# Patient Record
Sex: Female | Born: 1955
Health system: Southern US, Community
[De-identification: ages and names within clinical notes are randomized; demographics above are authoritative.]

## PROBLEM LIST (undated history)

## (undated) DIAGNOSIS — J449 Chronic obstructive pulmonary disease, unspecified: Secondary | ICD-10-CM

## (undated) HISTORY — PX: APPENDECTOMY: SHX54

---

## 2009-01-26 ENCOUNTER — Ambulatory Visit: Payer: Self-pay | Admitting: Family Medicine

## 2009-12-16 ENCOUNTER — Ambulatory Visit: Payer: Self-pay | Admitting: Family Medicine

## 2010-04-05 ENCOUNTER — Ambulatory Visit: Payer: Self-pay | Admitting: Family Medicine

## 2010-07-20 ENCOUNTER — Ambulatory Visit: Payer: Self-pay | Admitting: Family Medicine

## 2011-05-23 ENCOUNTER — Ambulatory Visit: Payer: Self-pay

## 2014-05-19 ENCOUNTER — Ambulatory Visit
Admit: 2014-05-19 | Disposition: A | Discharge status: Home / Self Care | Payer: Self-pay | Attending: Family Medicine | Admitting: Family Medicine

## 2014-06-05 ENCOUNTER — Ambulatory Visit
Admit: 2014-06-05 | Disposition: A | Discharge status: Home / Self Care | Payer: Self-pay | Attending: Family Medicine | Admitting: Family Medicine

## 2014-06-17 ENCOUNTER — Other Ambulatory Visit: Payer: Self-pay | Admitting: Family Medicine

## 2014-06-17 DIAGNOSIS — R928 Other abnormal and inconclusive findings on diagnostic imaging of breast: Secondary | ICD-10-CM

## 2014-07-01 ENCOUNTER — Ambulatory Visit
Admission: RE | Admit: 2014-07-01 | Discharge: 2014-07-01 | Disposition: A | Discharge status: Home / Self Care | Payer: No Typology Code available for payment source | Source: Ambulatory Visit | Attending: Family Medicine | Admitting: Family Medicine

## 2014-07-01 DIAGNOSIS — R928 Other abnormal and inconclusive findings on diagnostic imaging of breast: Secondary | ICD-10-CM

## 2014-07-01 MED ORDER — GADOBENATE DIMEGLUMINE 529 MG/ML IV SOLN
12.0000 mL | Freq: Once | INTRAVENOUS | Status: AC | PRN
  Administered 2014-07-01: 12 mL via INTRAVENOUS

## 2014-07-03 ENCOUNTER — Other Ambulatory Visit: Payer: Self-pay | Admitting: Family Medicine

## 2014-07-03 DIAGNOSIS — R928 Other abnormal and inconclusive findings on diagnostic imaging of breast: Secondary | ICD-10-CM

## 2014-07-04 ENCOUNTER — Other Ambulatory Visit: Payer: Self-pay

## 2014-07-15 ENCOUNTER — Ambulatory Visit
Admission: RE | Admit: 2014-07-15 | Discharge: 2014-07-15 | Disposition: A | Discharge status: Home / Self Care | Payer: No Typology Code available for payment source | Source: Ambulatory Visit | Attending: Family Medicine | Admitting: Family Medicine

## 2014-07-15 DIAGNOSIS — R928 Other abnormal and inconclusive findings on diagnostic imaging of breast: Secondary | ICD-10-CM

## 2014-07-15 HISTORY — PX: BREAST BIOPSY: SHX20

## 2014-07-15 MED ORDER — GADOBENATE DIMEGLUMINE 529 MG/ML IV SOLN: 12.0000 mL | Freq: Once | INTRAVENOUS | Status: AC | PRN

## 2015-01-09 ENCOUNTER — Emergency Department: Payer: No Typology Code available for payment source

## 2015-01-09 ENCOUNTER — Encounter: Payer: Self-pay | Admitting: Emergency Medicine

## 2015-01-09 ENCOUNTER — Emergency Department
Admission: EM | Admit: 2015-01-09 | Discharge: 2015-01-09 | Disposition: A | Discharge status: Home / Self Care | Payer: No Typology Code available for payment source | Attending: Emergency Medicine | Admitting: Emergency Medicine

## 2015-01-09 DIAGNOSIS — Y9289 Other specified places as the place of occurrence of the external cause: Secondary | ICD-10-CM | POA: Insufficient documentation

## 2015-01-09 DIAGNOSIS — S8002XA Contusion of left knee, initial encounter: Secondary | ICD-10-CM | POA: Diagnosis not present

## 2015-01-09 DIAGNOSIS — S0990XA Unspecified injury of head, initial encounter: Secondary | ICD-10-CM | POA: Diagnosis present

## 2015-01-09 DIAGNOSIS — S80212A Abrasion, left knee, initial encounter: Secondary | ICD-10-CM | POA: Diagnosis not present

## 2015-01-09 DIAGNOSIS — Y9389 Activity, other specified: Secondary | ICD-10-CM | POA: Insufficient documentation

## 2015-01-09 DIAGNOSIS — S0083XA Contusion of other part of head, initial encounter: Secondary | ICD-10-CM | POA: Diagnosis not present

## 2015-01-09 DIAGNOSIS — W1789XA Other fall from one level to another, initial encounter: Secondary | ICD-10-CM | POA: Insufficient documentation

## 2015-01-09 DIAGNOSIS — S93402A Sprain of unspecified ligament of left ankle, initial encounter: Secondary | ICD-10-CM

## 2015-01-09 DIAGNOSIS — Y998 Other external cause status: Secondary | ICD-10-CM | POA: Diagnosis not present

## 2015-01-09 MED ORDER — MELOXICAM 15 MG PO TABS: 15.0000 mg | ORAL_TABLET | Freq: Every day | ORAL | Status: AC

## 2015-01-09 MED ORDER — BACITRACIN ZINC 500 UNIT/GM EX OINT
TOPICAL_OINTMENT | Freq: Once | CUTANEOUS | Status: AC
  Administered 2015-01-09: 1 via TOPICAL
  Filled 2015-01-09: qty 0.9

## 2015-01-09 MED ORDER — MELOXICAM 7.5 MG PO TABS
15.0000 mg | ORAL_TABLET | Freq: Once | ORAL | Status: AC
  Administered 2015-01-09: 15 mg via ORAL
  Filled 2015-01-09: qty 2

## 2015-01-09 NOTE — ED Notes (Signed)
Patient with no complaints at this time. Respirations even and unlabored. Skin warm/dry. Discharge instructions reviewed with patient at this time. Patient given opportunity to voice concerns/ask questions. Patient discharged at this time and left Emergency Department, via wheelchair.   

## 2015-01-09 NOTE — ED Provider Notes (Signed)
[facility] Emergency Department Provider Note  ____________________________________________  Time seen: Approximately 8:52 PM  I have reviewed the triage vital signs and the nursing notes.   HISTORY  Chief Complaint Fall    HPI Betty Ramirez is a 59 y.o. female resents emergency department status post fall. Per the patient she was standing on a couch taking a picture of a Christmas tree when she lost her balance falling and landing on her left ankle knee and eventually striking her head on the coffee table. She denies being dizzy prior to event. She denies loss of consciousness or striking her head. Patient states she does not take any blood thinners. She hasn't forced some mild nauseousness since even but denies any vomiting. She denies any visual acuity changes, neck pain, chest pain, shortness of breath. Patient endorses an abrasion to the left knee as well. She states her left knee "aches," her left ankle is a sharp pain that is constant worse with weightbearing. She reports her headache is mild.   History reviewed. No pertinent past medical history.  There are no active problems to display for this patient.   History reviewed. No pertinent past surgical history.  Current Outpatient Rx  Name  Route  Sig  Dispense  Refill  . meloxicam (MOBIC) 15 MG tablet   Oral   Take 1 tablet (15 mg total) by mouth daily.   30 tablet   0     Allergies Biaxin and Sulfa antibiotics  No family history on file.  Social History Social History  Substance Use Topics  . Smoking status: Never Smoker   . Smokeless tobacco: None  . Alcohol Use: No    Review of Systems Constitutional: No fever/chills Eyes: No visual changes. ENT: No sore throat. Cardiovascular: Denies chest pain. Respiratory: Denies shortness of breath. Gastrointestinal: No abdominal pain.  No nausea, no vomiting.  No diarrhea.  No constipation. Genitourinary: Negative for  dysuria. Musculoskeletal: Negative for back pain. Her cyst left knee pain. Endorses left ankle pain  Skin: Negative for rash. Neurological: Endorses a headache but denies focal weakness or numbness.  10-point ROS otherwise negative.  ____________________________________________   PHYSICAL EXAM:  VITAL SIGNS: ED Triage Vitals  Enc Vitals Group     BP 01/09/15 1933 182/81 mmHg     Pulse Rate 01/09/15 1933 74     Resp 01/09/15 1933 20     Temp 01/09/15 1933 98.2 F (36.8 C)     Temp Source 01/09/15 1933 Oral     SpO2 01/09/15 1933 98 %     Weight 01/09/15 1933 138 lb (62.596 kg)     Height 01/09/15 1933 5\' 2"  (1.575 m)     Head Cir --      Peak Flow --      Pain Score 01/09/15 1930 8     Pain Loc --      Pain Edu? --      Excl. in GC? --     Constitutional: Alert and oriented. Well appearing and in no acute distress. Eyes: Conjunctivae are normal. PERRL. EOMI. Head: Hematoma noted to left forehead. No crepitus. Tenderness to palpation over hematoma but no tenderness elsewhere. External auditory canals are within normal limits with no serosanguineous discharge. Nose: No congestion/rhinnorhea. No serosanguineous discharge from nares. Mouth/Throat: Mucous membranes are moist.  Oropharynx non-erythematous. Neck: No stridor.  No cervical spine tenderness to palpation. Cardiovascular: Normal rate, regular rhythm. Grossly normal heart sounds.  Good peripheral circulation. Respiratory: Normal  respiratory effort.  No retractions. Lungs CTAB. Gastrointestinal: Soft and nontender. No distention. No abdominal bruits. No CVA tenderness. Musculoskeletal: Mild edema noted to the anterior aspect of left knee compared with right. Diffuse tenderness to palpation over the surface level over abrasion. No palpable deformity. Full range of motion to knee. Varus, valgus, Lachman's negative. No visible deformity or edema noted to the left foot and ankle when compared with right. Patient is diffusely  tender to palpation over the lateral aspect of foot. No tenderness to palpation over the malleolus. Full range of motion to ankle and foot. Sensation and pulses intact distally. Neurologic:  Normal speech and language. No gross focal neurologic deficits are appreciated. No gait instability. Radial nerves II through XII grossly intact. Skin:  Skin is warm, dry and intact. No rash noted. Superficial abrasion noted to left knee. No visible foreign body. No bleeding. Psychiatric: Mood and affect are normal. Speech and behavior are normal.  ____________________________________________   LABS (all labs ordered are listed, but only abnormal results are displayed)  Labs Reviewed - No data to display ____________________________________________  EKG   ____________________________________________  RADIOLOGY  Left foot x-ray Impression: No acute bony abnormality. ____________________________________________   PROCEDURES  Procedure(s) performed: None  Critical Care performed: No  ____________________________________________   INITIAL IMPRESSION / ASSESSMENT AND PLAN / ED COURSE  Pertinent labs & imaging results that were available during my care of the patient were reviewed by me and considered in my medical decision making (see chart for details).  The patient's history, symptoms, physical exam are taken and consideration for diagnosis. Advised patient of findings and diagnosis she verbalizes understanding of same. Patient was neurologically intact. Discussed pros and cons of head CT and patient and her husband decided to not proceed with head CT at this time. I concur with their assessment. Patient will not receive a head CT. Patient is given strict ED precautions to return should symptoms worsen to include loss of consciousness, increased nausea or onset of vomiting, she'll acuity changes, neck pain, altered mental status. Patient has a diagnosis of contusion to left knee with abrasion  and left ankle sprain. I'll place the patient on meloxicam for symptomatic control. The patient is to keep the left knee wrapped with Ace bandage which is provided here in the emergency department. Patient will follow-up with primary care or orthopedics should symptoms persist past treatment course. Patient verbalizes understanding and agreement with treatment plan. ____________________________________________   FINAL CLINICAL IMPRESSION(S) / ED DIAGNOSES  Final diagnoses:  Traumatic hematoma of forehead, initial encounter  Abrasion, knee, left, initial encounter  Knee contusion, left, initial encounter  Ankle sprain, left, initial encounter      Racheal Patches, PA-C 01/09/15 2118  Loleta Rose, MD 01/09/15 2322

## 2015-01-09 NOTE — Discharge Instructions (Signed)
Abrasion An abrasion is a cut or scrape on the outer surface of your skin. An abrasion does not extend through all of the layers of your skin. It is important to care for your abrasion properly to prevent infection. CAUSES Most abrasions are caused by falling on or gliding across the ground or another surface. When your skin rubs on something, the outer and inner layer of skin rubs off.  SYMPTOMS A cut or scrape is the main symptom of this condition. The scrape may be bleeding, or it may appear red or pink. If there was an associated fall, there may be an underlying bruise. DIAGNOSIS An abrasion is diagnosed with a physical exam. TREATMENT Treatment for this condition depends on how large and deep the abrasion is. Usually, your abrasion will be cleaned with water and mild soap. This removes any dirt or debris that may be stuck. An antibiotic ointment may be applied to the abrasion to help prevent infection. A bandage (dressing) may be placed on the abrasion to keep it clean. You may also need a tetanus shot. HOME CARE INSTRUCTIONS Medicines  Take or apply medicines only as directed by your health care provider.  If you were prescribed an antibiotic ointment, finish all of it even if you start to feel better. Wound Care  Clean the wound with mild soap and water 2-3 times per day or as directed by your health care provider. Pat your wound dry with a clean towel. Do not rub it.  There are many different ways to close and cover a wound. Follow instructions from your health care provider about:  Wound care.  Dressing changes and removal.  Check your wound every day for signs of infection. Watch for:  Redness, swelling, or pain.  Fluid, blood, or pus. General Instructions  Keep the dressing dry as directed by your health care provider. Do not take baths, swim, use a hot tub, or do anything that would put your wound underwater until your health care provider approves.  If there is  swelling, raise (elevate) the injured area above the level of your heart while you are sitting or lying down.  Keep all follow-up visits as directed by your health care provider. This is important. SEEK MEDICAL CARE IF:  You received a tetanus shot and you have swelling, severe pain, redness, or bleeding at the injection site.  Your pain is not controlled with medicine.  You have increased redness, swelling, or pain at the site of your wound. SEEK IMMEDIATE MEDICAL CARE IF:  You have a red streak going away from your wound.  You have a fever.  You have fluid, blood, or pus coming from your wound.  You notice a bad smell coming from your wound or your dressing.   This information is not intended to replace advice given to you by your health care provider. Make sure you discuss any questions you have with your health care provider.   Document Released: 11/09/2004 Document Revised: 10/21/2014 Document Reviewed: 01/28/2014 Elsevier Interactive Patient Education 2016 [employer]  Ankle Sprain An ankle sprain is an injury to the strong, fibrous tissues (ligaments) that hold the bones of your ankle joint together.  CAUSES An ankle sprain is usually caused by a fall or by twisting your ankle. Ankle sprains most commonly occur when you step on the outer edge of your foot, and your ankle turns inward. People who participate in sports are more prone to these types of injuries.  SYMPTOMS   Pain  in your ankle. The pain may be present at rest or only when you are trying to stand or walk.  Swelling.  Bruising. Bruising may develop immediately or within 1 to 2 days after your injury.  Difficulty standing or walking, particularly when turning corners or changing directions. DIAGNOSIS  Your caregiver will ask you details about your injury and perform a physical exam of your ankle to determine if you have an ankle sprain. During the physical exam, your caregiver will press on and apply  pressure to specific areas of your foot and ankle. Your caregiver will try to move your ankle in certain ways. An X-ray exam may be done to be sure a bone was not broken or a ligament did not separate from one of the bones in your ankle (avulsion fracture).  TREATMENT  Certain types of braces can help stabilize your ankle. Your caregiver can make a recommendation for this. Your caregiver may recommend the use of medicine for pain. If your sprain is severe, your caregiver may refer you to a surgeon who helps to restore function to parts of your skeletal system (orthopedist) or a physical therapist. HOME CARE INSTRUCTIONS   Apply ice to your injury for 1-2 days or as directed by your caregiver. Applying ice helps to reduce inflammation and pain.  Put ice in a plastic bag.  Place a towel between your skin and the bag.  Leave the ice on for 15-20 minutes at a time, every 2 hours while you are awake.  Only take over-the-counter or prescription medicines for pain, discomfort, or fever as directed by your caregiver.  Elevate your injured ankle above the level of your heart as much as possible for 2-3 days.  If your caregiver recommends crutches, use them as instructed. Gradually put weight on the affected ankle. Continue to use crutches or a cane until you can walk without feeling pain in your ankle.  If you have a plaster splint, wear the splint as directed by your caregiver. Do not rest it on anything harder than a pillow for the first 24 hours. Do not put weight on it. Do not get it wet. You may take it off to take a shower or bath.  You may have been given an elastic bandage to wear around your ankle to provide support. If the elastic bandage is too tight (you have numbness or tingling in your foot or your foot becomes cold and blue), adjust the bandage to make it comfortable.  If you have an air splint, you may blow more air into it or let air out to make it more comfortable. You may take your  splint off at night and before taking a shower or bath. Wiggle your toes in the splint several times per day to decrease swelling. SEEK MEDICAL CARE IF:   You have rapidly increasing bruising or swelling.  Your toes feel extremely cold or you lose feeling in your foot.  Your pain is not relieved with medicine. SEEK IMMEDIATE MEDICAL CARE IF:  Your toes are numb or blue.  You have severe pain that is increasing. MAKE SURE YOU:   Understand these instructions.  Will watch your condition.  Will get help right away if you are not doing well or get worse.   This information is not intended to replace advice given to you by your health care provider. Make sure you discuss any questions you have with your health care provider.   Document Released: 01/30/2005 Document Revised: 02/20/2014 Document  Reviewed: 02/11/2011 Elsevier Interactive Patient Education 2016 [employer] Inc.  Contusion A contusion is a deep bruise. Contusions are the result of a blunt injury to tissues and muscle fibers under the skin. The injury causes bleeding under the skin. The skin overlying the contusion may turn blue, purple, or yellow. Minor injuries will give you a painless contusion, but more severe contusions may stay painful and swollen for a few weeks.  CAUSES  This condition is usually caused by a blow, trauma, or direct force to an area of the body. SYMPTOMS  Symptoms of this condition include:  Swelling of the injured area.  Pain and tenderness in the injured area.  Discoloration. The area may have redness and then turn blue, purple, or yellow. DIAGNOSIS  This condition is diagnosed based on a physical exam and medical history. An X-ray, CT scan, or MRI may be needed to determine if there are any associated injuries, such as broken bones (fractures). TREATMENT  Specific treatment for this condition depends on what area of the body was injured. In general, the best treatment for a contusion is resting,  icing, applying pressure to (compression), and elevating the injured area. This is often called the RICE strategy. Over-the-counter anti-inflammatory medicines may also be recommended for pain control.  HOME CARE INSTRUCTIONS   Rest the injured area.  If directed, apply ice to the injured area:  Put ice in a plastic bag.  Place a towel between your skin and the bag.  Leave the ice on for 20 minutes, 2-3 times per day.  If directed, apply light compression to the injured area using an elastic bandage. Make sure the bandage is not wrapped too tightly. Remove and reapply the bandage as directed by your health care provider.  If possible, raise (elevate) the injured area above the level of your heart while you are sitting or lying down.  Take over-the-counter and prescription medicines only as told by your health care provider. SEEK MEDICAL CARE IF:  Your symptoms do not improve after several days of treatment.  Your symptoms get worse.  You have difficulty moving the injured area. SEEK IMMEDIATE MEDICAL CARE IF:   You have severe pain.  You have numbness in a hand or foot.  Your hand or foot turns pale or cold.   This information is not intended to replace advice given to you by your health care provider. Make sure you discuss any questions you have with your health care provider.   Document Released: 11/09/2004 Document Revised: 10/21/2014 Document Reviewed: 06/17/2014 Elsevier Interactive Patient Education 2016 Else[employer] Inc.  Adult nurselastic Bandage and RICE WHAT DOES AN ELASTIC BANDAGE DO? Elastic bandages come in different shapes and sizes. They generally provide support to your injury and reduce swelling while you are healing, but they can perform different functions. Your health care provider will help you to decide what is best for your protection, recovery, or rehabilitation following an injury. WHAT ARE SOME GENERAL TIPS FOR USING AN ELASTIC BANDAGE?  Use the bandage as  directed by the maker of the bandage that you are using.  Do not wrap the bandage too tightly. This may cut off the circulation in the arm or leg in the area below the bandage.  If part of your body beyond the bandage becomes blue, numb, cold, swollen, or is more painful, your bandage is most likely too tight. If this occurs, remove your bandage and reapply it more loosely.  See your health care provider if the bandage seems  to be making your problems worse rather than better.  An elastic bandage should be removed and reapplied every 3-4 hours or as directed by your health care provider. WHAT IS RICE? The routine care of many injuries includes rest, ice, compression, and elevation (RICE therapy).  Rest Rest is required to allow your body to heal. Generally, you can resume your routine activities when you are comfortable and have been given permission by your health care provider. Ice Icing your injury helps to keep the swelling down and it reduces pain. Do not apply ice directly to your skin.  Put ice in a plastic bag.  Place a towel between your skin and the bag.  Leave the ice on for 20 minutes, 2-3 times per day. Do this for as long as you are directed by your health care provider. Compression Compression helps to keep swelling down, gives support, and helps with discomfort. Compression may be done with an elastic bandage. Elevation Elevation helps to reduce swelling and it decreases pain. If possible, your injured area should be placed at or above the level of your heart or the center of your chest. WHEN SHOULD I SEEK MEDICAL CARE? You should seek medical care if:  You have persistent pain and swelling.  Your symptoms are getting worse rather than improving. These symptoms may indicate that further evaluation or further X-rays are needed. Sometimes, X-rays may not show a small broken bone (fracture) until a number of days later. Make a follow-up appointment with your health care  provider. Ask when your X-ray results will be ready. Make sure that you get your X-ray results. WHEN SHOULD I SEEK IMMEDIATE MEDICAL CARE? You should seek immediate medical care if:  You have a sudden onset of severe pain at or below the area of your injury.  You develop redness or increased swelling around your injury.  You have tingling or numbness at or below the area of your injury that does not improve after you remove the elastic bandage.   This information is not intended to replace advice given to you by your health care provider. Make sure you discuss any questions you have with your health care provider.   Document Released: 07/22/2001 Document Revised: 10/21/2014 Document Reviewed: 09/15/2013 Elsevier Interactive Patient Education [employer]

## 2015-01-09 NOTE — ED Notes (Signed)
Pt fell about 45 minutes ago.  Pt was taking a picture and fell and hit her head and twisted her left ankle.  Pt denies losing consciousness or having any dizziness pre or post fall.  Pt is AOx4.  Pt denies on being on any blood thinner.  She did have some N/V post fall.

## 2016-06-20 ENCOUNTER — Telehealth: Payer: Self-pay | Admitting: *Deleted

## 2016-06-20 NOTE — Telephone Encounter (Signed)
Received referral for initial lung cancer screening scan. Contacted patient and obtained smoking history,(*) as well as answering questions related to screening process. Patient denies signs of lung cancer such as weight loss or hemoptysis. Patient denies comorbidity that would prevent curative treatment if lung cancer were found. Patient must have appt on a Wednesday or later in the day. Will contact patient in June as the requested appt is currently not available.

## 2016-07-31 ENCOUNTER — Ambulatory Visit: Payer: Self-pay | Attending: Oncology

## 2016-07-31 ENCOUNTER — Ambulatory Visit
Admission: RE | Admit: 2016-07-31 | Discharge: 2016-07-31 | Disposition: A | Discharge status: Home / Self Care | Payer: Self-pay | Source: Ambulatory Visit | Attending: Oncology | Admitting: Oncology

## 2016-07-31 VITALS — BP 131/83 | HR 60 | Temp 98.3°F | Ht 63.0 in | Wt 138.0 lb

## 2016-07-31 DIAGNOSIS — N63 Unspecified lump in unspecified breast: Secondary | ICD-10-CM

## 2016-07-31 NOTE — Progress Notes (Signed)
Subjective:     Patient ID: Betty Ramirez, female   DOB: Sep 28, 1955, 61 y.o.   MRN: [id number]  HPI   Review of Systems     Objective:   Physical Exam  Pulmonary/Chest: Right breast exhibits no inverted nipple, no mass, no nipple discharge, no skin change and no tenderness. Left breast exhibits no inverted nipple, no mass, no nipple discharge, no skin change and no tenderness. Breasts are symmetrical.    Biopsy scar left breast       Assessment:     61 year old patient presents for [facility] clinic visit.  Patient had benign left breast biopsy at [facility] in June 2016, but did not return for recommended 6 month follow-up mammogram, and ultrasound.  Patient screened, and meets [facility] eligibility.  Patient does not have insurance, Medicare or Medicaid.  Handout given on Affordable Care Act.Instructed patient on breast self-exam using teach back method  CBE unremarkable. No mass or lump palpated.     Plan:     Sent for bilateral diagnostic mammogram and ultrasound

## 2016-07-31 NOTE — Progress Notes (Signed)
Letter mailed from [facility] to notify of normal mammogram results.  Patient to return in one year for annual screening.  Copy to [facility]. 

## 2016-08-08 ENCOUNTER — Telehealth: Payer: Self-pay | Admitting: *Deleted

## 2016-08-08 NOTE — Telephone Encounter (Signed)
Received referral for low dose lung cancer screening CT scan. Message left at phone number listed in EMR for patient to call me back to facilitate scheduling scan.  

## 2016-08-09 ENCOUNTER — Telehealth: Payer: Self-pay | Admitting: *Deleted

## 2016-08-09 NOTE — Telephone Encounter (Signed)
Received referral for low dose lung cancer screening CT scan. Message left at phone number listed in EMR for patient to call me back to facilitate scheduling scan.  

## 2016-08-31 ENCOUNTER — Encounter: Payer: Self-pay | Admitting: *Deleted

## 2017-03-04 ENCOUNTER — Emergency Department: Payer: Self-pay

## 2017-03-04 ENCOUNTER — Other Ambulatory Visit: Payer: Self-pay

## 2017-03-04 ENCOUNTER — Emergency Department
Admission: EM | Admit: 2017-03-04 | Discharge: 2017-03-04 | Disposition: A | Discharge status: Home / Self Care | Payer: Self-pay | Attending: Emergency Medicine | Admitting: Emergency Medicine

## 2017-03-04 ENCOUNTER — Encounter: Payer: Self-pay | Admitting: *Deleted

## 2017-03-04 DIAGNOSIS — J101 Influenza due to other identified influenza virus with other respiratory manifestations: Secondary | ICD-10-CM

## 2017-03-04 DIAGNOSIS — Z87891 Personal history of nicotine dependence: Secondary | ICD-10-CM | POA: Insufficient documentation

## 2017-03-04 DIAGNOSIS — J102 Influenza due to other identified influenza virus with gastrointestinal manifestations: Secondary | ICD-10-CM | POA: Insufficient documentation

## 2017-03-04 DIAGNOSIS — Z79899 Other long term (current) drug therapy: Secondary | ICD-10-CM | POA: Insufficient documentation

## 2017-03-04 DIAGNOSIS — J441 Chronic obstructive pulmonary disease with (acute) exacerbation: Secondary | ICD-10-CM | POA: Insufficient documentation

## 2017-03-04 DIAGNOSIS — R112 Nausea with vomiting, unspecified: Secondary | ICD-10-CM

## 2017-03-04 DIAGNOSIS — R197 Diarrhea, unspecified: Secondary | ICD-10-CM

## 2017-03-04 HISTORY — DX: Chronic obstructive pulmonary disease, unspecified: J44.9

## 2017-03-04 LAB — COMPREHENSIVE METABOLIC PANEL
ALT: 24 U/L (ref 14–54)
AST: 32 U/L (ref 15–41)
Albumin: 4 g/dL (ref 3.5–5.0)
Alkaline Phosphatase: 68 U/L (ref 38–126)
Anion gap: 9 (ref 5–15)
BILIRUBIN TOTAL: 0.7 mg/dL (ref 0.3–1.2)
BUN: 11 mg/dL (ref 6–20)
CHLORIDE: 105 mmol/L (ref 101–111)
CO2: 26 mmol/L (ref 22–32)
CREATININE: 0.73 mg/dL (ref 0.44–1.00)
Calcium: 9 mg/dL (ref 8.9–10.3)
GFR calc Af Amer: >60 mL/min (ref >60)
GLUCOSE: 106 mg/dL — AB (ref 65–99)
Potassium: 3.4 mmol/L — ABNORMAL LOW (ref 3.5–5.1)
Sodium: 140 mmol/L (ref 135–145)
Total Protein: 7.6 g/dL (ref 6.5–8.1)

## 2017-03-04 LAB — CBC
HCT: 45.3 % (ref 35.0–47.0)
Hemoglobin: 15.1 g/dL (ref 12.0–16.0)
MCH: 29.2 pg (ref 26.0–34.0)
MCHC: 33.4 g/dL (ref 32.0–36.0)
MCV: 87.5 fL (ref 80.0–100.0)
PLATELETS: 222 10*3/uL (ref 150–440)
RBC: 5.18 MIL/uL (ref 3.80–5.20)
RDW: 13.7 % (ref 11.5–14.5)
WBC: 5.8 10*3/uL (ref 3.6–11.0)

## 2017-03-04 LAB — URINALYSIS, COMPLETE (UACMP) WITH MICROSCOPIC
BILIRUBIN URINE: NEGATIVE
Bacteria, UA: NONE SEEN
Glucose, UA: NEGATIVE mg/dL
Ketones, ur: 20 mg/dL — AB
LEUKOCYTES UA: NEGATIVE
Nitrite: NEGATIVE
PH: 6 (ref 5.0–8.0)
Protein, ur: NEGATIVE mg/dL
SPECIFIC GRAVITY, URINE: 1.005 (ref 1.005–1.030)

## 2017-03-04 LAB — INFLUENZA PANEL BY PCR (TYPE A & B)
INFLAPCR: POSITIVE — AB
INFLBPCR: NEGATIVE

## 2017-03-04 LAB — TROPONIN I

## 2017-03-04 LAB — LIPASE, BLOOD: LIPASE: 34 U/L (ref 11–51)

## 2017-03-04 MED ORDER — METHYLPREDNISOLONE SODIUM SUCC 125 MG IJ SOLR
125.0000 mg | Freq: Once | INTRAMUSCULAR | Status: AC
  Administered 2017-03-04: 125 mg via INTRAVENOUS
  Filled 2017-03-04: qty 2

## 2017-03-04 MED ORDER — ONDANSETRON HCL 4 MG PO TABS: 4.0000 mg | ORAL_TABLET | Freq: Three times a day (TID) | ORAL | 0 refills | Status: DC | PRN

## 2017-03-04 MED ORDER — OSELTAMIVIR PHOSPHATE 75 MG PO CAPS: 75.0000 mg | ORAL_CAPSULE | Freq: Two times a day (BID) | ORAL | 0 refills | Status: AC

## 2017-03-04 MED ORDER — OSELTAMIVIR PHOSPHATE 75 MG PO CAPS
75.0000 mg | ORAL_CAPSULE | Freq: Once | ORAL | Status: AC
  Administered 2017-03-04: 75 mg via ORAL
  Filled 2017-03-04: qty 1

## 2017-03-04 MED ORDER — ONDANSETRON 4 MG PO TBDP: 4.0000 mg | ORAL_TABLET | Freq: Three times a day (TID) | ORAL | 0 refills | Status: AC | PRN

## 2017-03-04 MED ORDER — IPRATROPIUM-ALBUTEROL 0.5-2.5 (3) MG/3ML IN SOLN
3.0000 mL | Freq: Once | RESPIRATORY_TRACT | Status: AC
  Administered 2017-03-04: 3 mL via RESPIRATORY_TRACT
  Filled 2017-03-04: qty 3

## 2017-03-04 MED ORDER — PREDNISONE 10 MG PO TABS: ORAL_TABLET | ORAL | 0 refills | Status: DC

## 2017-03-04 MED ORDER — SODIUM CHLORIDE 0.9 % IV BOLUS (SEPSIS)
1000.0000 mL | Freq: Once | INTRAVENOUS | Status: AC
  Administered 2017-03-04: 1000 mL via INTRAVENOUS

## 2017-03-04 NOTE — ED Notes (Signed)

## 2017-03-04 NOTE — ED Provider Notes (Signed)
[facility] Emergency Department Provider Note ____________________________________________   I have reviewed the triage vital signs and the triage nursing note.  HISTORY  Chief Complaint Emesis and Diarrhea   Historian Patient  HPI Betty Ramirez is a 62 y.o. female with a history of COPD, states that she went to her primary care doctor's office on Tuesday and was placed on doxycycline and a cough syrup for upper respiratory infection, and then on Thursday she started to have nausea and by Friday and Saturday she has had numerous episodes of emesis as well as dry heaving which is continued.  Nonbloody.  She is also had loose stools, several times.  No black or bloody stools.  Some chills without documented fevers.  She continues to wheeze and have trouble breathing.  Mild soreness of the abdomen from heaving.  No focal abdominal pain.     Past Medical History:  Diagnosis Date  . COPD (chronic obstructive pulmonary disease) (HCC)     There are no active problems to display for this patient.   Past Surgical History:  Procedure Laterality Date  . BREAST BIOPSY Left 07/15/2014   Neg    Prior to Admission medications   Medication Sig Start Date End Date Taking? Authorizing Provider  meloxicam (MOBIC) 15 MG tablet Take 1 tablet (15 mg total) by mouth daily. 01/09/15   Cuthriell, Delorise Royals, PA-C  ondansetron (ZOFRAN) 4 MG tablet Take 1 tablet (4 mg total) by mouth every 8 (eight) hours as needed for nausea or vomiting. 03/04/17   Governor Rooks, MD  predniSONE (DELTASONE) 10 MG tablet 40 mg daily for 4 more days 03/04/17   Governor Rooks, MD    Allergies  Allergen Reactions  . Biaxin [Clarithromycin]   . Sulfa Antibiotics     Family History  Problem Relation Age of Onset  . Breast cancer Neg Hx     Social History Social History   Tobacco Use  . Smoking status: Former Games developer  . Smokeless tobacco: Never Used  Substance Use Topics  .  Alcohol use: No  . Drug use: Not on file    Review of Systems  Constitutional: Negative for fever, for some chills. Eyes: Negative for visual changes. ENT: Negative for sore throat. Cardiovascular: Negative for chest pain. Respiratory: Positive for wheezing coughing and for shortness of breath.  No productive sputum. Gastrointestinal: Abdominal soreness.  Vomiting and diarrhea as per HPI. Genitourinary: Negative for dysuria. Musculoskeletal: Negative for back pain. Skin: Negative for rash. Neurological: Negative for headache.  ____________________________________________   PHYSICAL EXAM:  VITAL SIGNS: ED Triage Vitals  Enc Vitals Group     BP 03/04/17 1403 134/89     Pulse Rate 03/04/17 1403 86     Resp 03/04/17 1403 16     Temp 03/04/17 1403 98.2 F (36.8 C)     Temp Source 03/04/17 1403 Oral     SpO2 03/04/17 1403 94 %     Weight 03/04/17 1403 138 lb (62.6 kg)     Height 03/04/17 1422 5\' 3"  (1.6 m)     Head Circumference --      Peak Flow --      Pain Score 03/04/17 1403 7     Pain Loc --      Pain Edu? --      Excl. in GC? --      Constitutional: Alert and oriented. Well appearing and in no distress. HEENT   Head: Normocephalic and atraumatic.  Eyes: Conjunctivae are normal. Pupils equal and round.       Ears:         Nose: No congestion/rhinnorhea.   Mouth/Throat: Mucous membranes are moderately dry.   Neck: No stridor. Cardiovascular/Chest: Normal rate, regular rhythm.  No murmurs, rubs, or gallops. Respiratory: Normal respiratory effort without tachypnea nor retractions. Moderate wheezing in all fields. Gastrointestinal: Soft. No distention, no guarding, no rebound. Nontender.    Genitourinary/rectal:Deferred Musculoskeletal: Nontender with normal range of motion in all extremities. No joint effusions.  No lower extremity tenderness.  No edema. Neurologic:  Normal speech and language. No gross or focal neurologic deficits are  appreciated. Skin:  Skin is warm, dry and intact. No rash noted. Psychiatric: Mood and affect are normal. Speech and behavior are normal. Patient exhibits appropriate insight and judgment.   ____________________________________________  LABS (pertinent positives/negatives) I, Governor Rooksebecca Philbert Ocallaghan, MD the attending physician have reviewed the labs noted below.  Labs Reviewed  COMPREHENSIVE METABOLIC PANEL - Abnormal; Notable for the following components:      Result Value   Potassium 3.4 (*)    Glucose, Bld 106 (*)    All other components within normal limits  LIPASE, BLOOD  CBC  TROPONIN I  URINALYSIS, COMPLETE (UACMP) WITH MICROSCOPIC  INFLUENZA PANEL BY PCR (TYPE A & B)    ____________________________________________    EKG I, Governor Rooksebecca Caralyn Twining, MD, the attending physician have personally viewed and interpreted all ECGs.  86 bpm.  Normal sinus rhythm.  Nonspecific intraventricular conduction delay.  Nonspecific ST and T wave ____________________________________________  RADIOLOGY All Xrays were viewed by me.  Imaging interpreted by Radiologist, and I, Governor Rooksebecca Gwendola Hornaday, MD the attending physician have reviewed the radiologist interpretation noted below.  Dg abd with chest:  IMPRESSION: No active cardiopulmonary or abdominal process. Stable changes of probable chronic obstructive pulmonary disease. __________________________________________  PROCEDURES  Procedure(s) performed: None  Critical Care performed: None   ____________________________________________  ED COURSE / ASSESSMENT AND PLAN  Pertinent labs & imaging results that were available during my care of the patient were reviewed by me and considered in my medical decision making (see chart for details).   Patient looks very dehydrated clinically and is giving a history of vomiting and diarrhea for almost 4 days now and then upper respiratory bronchitis/COPD exacerbation for about a week.  She is not hypoxic and is  not having fever here and at no hypotension or tachycardia, however I am going to treat her for COPD exacerbation and add Solu-Medrol.  She looks dehydrated and is getting IV fluid bolus here.  I am treating her with Zofran for nausea.  She is having abdominal cramping so I am going to give her a small dose of morphine.  Overall her laboratory studies are reassuring with no significant electrolyte disturbance.  She does not have an elevated white blood cell count.  She does not have kidney failure.   Patient care to be transferred to Dr. Don PerkingVeronese at shift change.  Influenza, urine analysis, x-ray, and reevaluation of breathing and nausea and clinical symptoms of dehydration for disposition.  If patient reassuring, I have prepared discharge instructions.   DIFFERENTIAL DIAGNOSIS: Including but not limited to COPD exacerbation, bronchitis, influenza, viral gastroenteritis, sepsis, dehydration, electrolyte disturbance, anemia, pneumonia, etc.  CONSULTATIONS:  None   Patient / Family / Caregiver informed of clinical course, medical decision-making process, and agree with plan.   I discussed return precautions, follow-up instructions, and discharge instructions with patient and/or family.  Discharge Instructions :  You are evaluated for wheezing and are being treated for COPD exacerbation, and I am adding prednisone.  You were also evaluated for vomiting and diarrhea, and are being discharged with nausea medication.  You may try over-the-counter Imodium for diarrhea symptoms.  Please continue to make sure you are drinking in any fluids.  Your exam and evaluation are overall reassuring in the emergency department today.  Return to the emergency department immediately for any worsening condition for any fever, vomiting blood, black or bloody stools, worsening or uncontrolled abdominal pain, chest pain, dizziness or passing out, confusion or altered mental status, or any other symptoms concerning  to you.     ___________________________________________   FINAL CLINICAL IMPRESSION(S) / ED DIAGNOSES   Final diagnoses:  COPD with acute exacerbation (HCC)  Diarrhea, unspecified type  Nausea and vomiting, intractability of vomiting not specified, unspecified vomiting type      ___________________________________________        Note: This dictation was prepared with Dragon dictation. Any transcriptional errors that result from this process are unintentional    Governor Rooks, MD 03/04/17 1547

## 2017-03-04 NOTE — ED Triage Notes (Signed)
Pt to ED reporting NVD for the past two days. No fevers at home. PT reports she has had hot flashes and cold chills. 4 episodes of dry heaves reported to day and 2 episodes of diarrhea. Middle, generalized abd reported.

## 2017-03-04 NOTE — ED Provider Notes (Signed)
-----------------------------------------   5:18 PM on 03/04/2017 -----------------------------------------   Blood pressure 134/89, pulse 86, temperature 98.2 F (36.8 C), temperature source Oral, resp. rate 16, height 5\' 3"  (1.6 m), weight 56.2 kg (124 lb), SpO2 94 %.  Assuming care from Dr. Shaune PollackLord of Betty BottcherJanice L Ramirez is a 62 y.o. female with a chief complaint of Emesis and Diarrhea .    Please refer to H&P by previous MD for further details.  The current plan of care is to f/u Flu and reassess after duonebs. Flu positive. Will start patient on Tamiflu. The patient reports that she feels improved after DuoNeb and steroids. She is ready on doxycycline which was started by PCP. She is moving good air with normal vital signs. No episodes of vomiting or diarrhea in the emergency room looks well-hydrated after receiving a liter bolus. Labs are within normal limits. Will dc on continued doxy, start prednisone and Tamiflu. Given zofran as well. Discussed return precautions with patient and close f/u with PCP.        Don PerkingVeronese, [place], MD 03/04/17 [phone]

## 2017-03-04 NOTE — Discharge Instructions (Addendum)
You are evaluated for wheezing and are being treated for COPD exacerbation and Flu, and I am adding prednisone and tamiflu. You are also being given nausea medicine. Please continue to make sure you are drinking in any fluids.  Your exam and evaluation are overall reassuring in the emergency department today.  Return to the emergency department immediately for any worsening condition for any fever, vomiting blood, black or bloody stools, worsening or uncontrolled abdominal pain, chest pain, dizziness or passing out, confusion or altered mental status, or any other symptoms concerning to you.

## 2017-12-19 ENCOUNTER — Other Ambulatory Visit: Payer: Self-pay

## 2017-12-19 ENCOUNTER — Ambulatory Visit
Admission: RE | Admit: 2017-12-19 | Discharge: 2017-12-19 | Disposition: A | Discharge status: Home / Self Care | Payer: Self-pay | Source: Ambulatory Visit | Attending: Oncology | Admitting: Oncology

## 2017-12-19 ENCOUNTER — Encounter: Payer: Self-pay | Admitting: *Deleted

## 2017-12-19 ENCOUNTER — Encounter (INDEPENDENT_AMBULATORY_CARE_PROVIDER_SITE_OTHER): Payer: Self-pay

## 2017-12-19 ENCOUNTER — Ambulatory Visit: Payer: Self-pay | Attending: Oncology | Admitting: *Deleted

## 2017-12-19 VITALS — BP 131/83 | HR 69 | Temp 98.1°F | Ht 63.0 in | Wt 144.0 lb

## 2017-12-19 DIAGNOSIS — Z0000 Encounter for general adult medical examination without abnormal findings: Secondary | ICD-10-CM

## 2017-12-19 NOTE — Patient Instructions (Signed)
Gave patient hand-out, Women Staying Healthy, Active and Well from [facility], with education on breast health, pap smears, heart and colon health. 

## 2017-12-19 NOTE — Progress Notes (Signed)
  Subjective:     Patient ID: Betty Ramirez, female   DOB: May 23, 1955, 62 y.o.   MRN: [id number]  HPI   Review of Systems     Objective:   Physical Exam  Pulmonary/Chest: Right breast exhibits no inverted nipple, no mass, no nipple discharge, no skin change and no tenderness. Left breast exhibits no inverted nipple, no mass, no nipple discharge, no skin change and no tenderness. Breasts are asymmetrical.  Right breast larger than the left       Assessment:     62 year old White female returns to [facility] for annual screening.  Clinical breast exam unremarkable.  Taught self breast awareness.  Family history of breast cancer in her sister.  Patient with a history of hysterectomy for bleeding.  No pap per protocol.  Patient has been screened for eligibility.  She does not have any insurance, Medicare or Medicaid.  She also meets financial eligibility.  Hand-out given on the Affordable Care Act. Risk Assessment    Risk Scores      12/19/2017   Last edited by: Scarlett Presto, RN   5-year risk: 3.4 %   Lifetime risk: 14.6 %            Plan:     Screening mammogram ordered.  Will follow-up per [facility] protocol.

## 2018-01-03 ENCOUNTER — Encounter: Payer: Self-pay | Admitting: *Deleted

## 2018-01-03 NOTE — Progress Notes (Signed)
Letter mailed from the [facility] to inform patient of her normal mammogram results.  Patient is to follow-up with annual screening in one year.  HSIS to Christy. 

## 2018-03-04 ENCOUNTER — Other Ambulatory Visit: Payer: Self-pay

## 2018-03-04 ENCOUNTER — Emergency Department
Admission: EM | Admit: 2018-03-04 | Discharge: 2018-03-04 | Disposition: A | Discharge status: Home / Self Care | Payer: No Typology Code available for payment source | Attending: Emergency Medicine | Admitting: Emergency Medicine

## 2018-03-04 ENCOUNTER — Emergency Department: Payer: No Typology Code available for payment source

## 2018-03-04 DIAGNOSIS — I7090 Unspecified atherosclerosis: Secondary | ICD-10-CM | POA: Insufficient documentation

## 2018-03-04 DIAGNOSIS — R1084 Generalized abdominal pain: Secondary | ICD-10-CM | POA: Insufficient documentation

## 2018-03-04 DIAGNOSIS — Y999 Unspecified external cause status: Secondary | ICD-10-CM | POA: Diagnosis not present

## 2018-03-04 DIAGNOSIS — M545 Low back pain, unspecified: Secondary | ICD-10-CM

## 2018-03-04 DIAGNOSIS — Z23 Encounter for immunization: Secondary | ICD-10-CM | POA: Diagnosis not present

## 2018-03-04 DIAGNOSIS — Y92410 Unspecified street and highway as the place of occurrence of the external cause: Secondary | ICD-10-CM | POA: Insufficient documentation

## 2018-03-04 DIAGNOSIS — M5020 Other cervical disc displacement, unspecified cervical region: Secondary | ICD-10-CM | POA: Diagnosis not present

## 2018-03-04 DIAGNOSIS — Z79899 Other long term (current) drug therapy: Secondary | ICD-10-CM | POA: Insufficient documentation

## 2018-03-04 DIAGNOSIS — J449 Chronic obstructive pulmonary disease, unspecified: Secondary | ICD-10-CM | POA: Insufficient documentation

## 2018-03-04 DIAGNOSIS — R918 Other nonspecific abnormal finding of lung field: Secondary | ICD-10-CM | POA: Diagnosis not present

## 2018-03-04 DIAGNOSIS — Y9389 Activity, other specified: Secondary | ICD-10-CM | POA: Insufficient documentation

## 2018-03-04 DIAGNOSIS — S80812A Abrasion, left lower leg, initial encounter: Secondary | ICD-10-CM | POA: Diagnosis not present

## 2018-03-04 DIAGNOSIS — Z87891 Personal history of nicotine dependence: Secondary | ICD-10-CM | POA: Diagnosis not present

## 2018-03-04 DIAGNOSIS — R51 Headache: Secondary | ICD-10-CM | POA: Diagnosis present

## 2018-03-04 LAB — BASIC METABOLIC PANEL
ANION GAP: 6 (ref 5–15)
BUN: 12 mg/dL (ref 8–23)
CHLORIDE: 107 mmol/L (ref 98–111)
CO2: 28 mmol/L (ref 22–32)
Calcium: 8.9 mg/dL (ref 8.9–10.3)
Creatinine, Ser: 0.95 mg/dL (ref 0.44–1.00)
GFR calc Af Amer: >60 mL/min (ref >60)
GLUCOSE: 101 mg/dL — AB (ref 70–99)
POTASSIUM: 3.5 mmol/L (ref 3.5–5.1)
Sodium: 141 mmol/L (ref 135–145)

## 2018-03-04 LAB — CBC
HCT: 39.4 % (ref 36.0–46.0)
Hemoglobin: 12.9 g/dL (ref 12.0–15.0)
MCH: 29.9 pg (ref 26.0–34.0)
MCHC: 32.7 g/dL (ref 30.0–36.0)
MCV: 91.2 fL (ref 80.0–100.0)
NRBC: 0 % (ref 0.0–0.2)
PLATELETS: 269 10*3/uL (ref 150–400)
RBC: 4.32 MIL/uL (ref 3.87–5.11)
RDW: 12.9 % (ref 11.5–15.5)
WBC: 9.5 10*3/uL (ref 4.0–10.5)

## 2018-03-04 MED ORDER — TETANUS-DIPHTH-ACELL PERTUSSIS 5-2.5-18.5 LF-MCG/0.5 IM SUSP
0.5000 mL | Freq: Once | INTRAMUSCULAR | Status: AC
Start: 2018-03-04 — End: 2018-03-04
  Administered 2018-03-04: 0.5 mL via INTRAMUSCULAR
  Filled 2018-03-04: qty 0.5

## 2018-03-04 MED ORDER — IBUPROFEN 600 MG PO TABS
600.0000 mg | ORAL_TABLET | Freq: Once | ORAL | Status: AC
  Administered 2018-03-04: 600 mg via ORAL
  Filled 2018-03-04: qty 1

## 2018-03-04 MED ORDER — CYCLOBENZAPRINE HCL 10 MG PO TABS
5.0000 mg | ORAL_TABLET | Freq: Once | ORAL | Status: AC
  Administered 2018-03-04: 5 mg via ORAL
  Filled 2018-03-04: qty 1

## 2018-03-04 MED ORDER — MORPHINE SULFATE (PF) 4 MG/ML IV SOLN: 4.0000 mg | Freq: Once | INTRAVENOUS | Status: DC

## 2018-03-04 MED ORDER — IBUPROFEN 600 MG PO TABS: 600.0000 mg | ORAL_TABLET | Freq: Four times a day (QID) | ORAL | 0 refills | Status: AC | PRN

## 2018-03-04 MED ORDER — OXYCODONE-ACETAMINOPHEN 5-325 MG PO TABS
1.0000 | ORAL_TABLET | Freq: Once | ORAL | Status: AC
  Administered 2018-03-04: 1 via ORAL
  Filled 2018-03-04: qty 1

## 2018-03-04 MED ORDER — IOHEXOL 300 MG/ML  SOLN
100.0000 mL | Freq: Once | INTRAMUSCULAR | Status: AC | PRN
  Administered 2018-03-04: 100 mL via INTRAVENOUS
  Filled 2018-03-04: qty 100

## 2018-03-04 MED ORDER — CYCLOBENZAPRINE HCL 5 MG PO TABS: ORAL_TABLET | ORAL | 0 refills | Status: AC

## 2018-03-04 NOTE — Discharge Instructions (Signed)
Your head CT is normal.  Your neck CT shows a bulging disc, which is likely chronic.  Please follow-up with neurosurgery for this.  Wear cervical collar until you have seen primary care or neurosurgery.  Return to the emergency department immediately for any arm weakness, numbness, tingling.  Your chest CT shows a mass that looks like a cyst and should be reevaluated by primary care.  Your CT also shows some cholesterol in your arteries.  You will likely be more stiff tomorrow and the following day.  Please take muscle relaxers and ibuprofen.  You can also take your home prescription of tramadol.
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
## 2018-03-04 NOTE — ED Notes (Addendum)
NAD noted at time of D/C. Pt denies questions or concerns. Pt taken to the lobby via wheelchair at this time.  
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
## 2019-02-28 ENCOUNTER — Other Ambulatory Visit: Payer: Self-pay | Admitting: Family Medicine

## 2019-02-28 ENCOUNTER — Ambulatory Visit
Admission: RE | Admit: 2019-02-28 | Discharge: 2019-02-28 | Disposition: A | Discharge status: Home / Self Care | Payer: Self-pay | Source: Ambulatory Visit | Attending: Family Medicine | Admitting: Family Medicine

## 2019-02-28 DIAGNOSIS — M25511 Pain in right shoulder: Secondary | ICD-10-CM

## 2020-01-11 DIAGNOSIS — Z20822 Contact with and (suspected) exposure to covid-19: Secondary | ICD-10-CM | POA: Diagnosis not present

## 2020-01-25 DIAGNOSIS — Z20822 Contact with and (suspected) exposure to covid-19: Secondary | ICD-10-CM | POA: Diagnosis not present

## 2020-01-25 DIAGNOSIS — Z87891 Personal history of nicotine dependence: Secondary | ICD-10-CM | POA: Diagnosis not present

## 2020-01-25 DIAGNOSIS — J441 Chronic obstructive pulmonary disease with (acute) exacerbation: Secondary | ICD-10-CM | POA: Diagnosis not present

## 2020-01-25 DIAGNOSIS — M791 Myalgia, unspecified site: Secondary | ICD-10-CM | POA: Diagnosis not present

## 2020-02-15 ENCOUNTER — Encounter: Payer: Self-pay | Admitting: Emergency Medicine

## 2020-02-15 ENCOUNTER — Emergency Department: Payer: PPO

## 2020-02-15 ENCOUNTER — Observation Stay
Admission: EM | Admit: 2020-02-15 | Discharge: 2020-02-16 | Disposition: A | Discharge status: Home / Self Care | Payer: PPO | Attending: Internal Medicine | Admitting: Internal Medicine

## 2020-02-15 ENCOUNTER — Other Ambulatory Visit: Payer: Self-pay

## 2020-02-15 DIAGNOSIS — Z79899 Other long term (current) drug therapy: Secondary | ICD-10-CM | POA: Insufficient documentation

## 2020-02-15 DIAGNOSIS — Z87891 Personal history of nicotine dependence: Secondary | ICD-10-CM | POA: Diagnosis not present

## 2020-02-15 DIAGNOSIS — J441 Chronic obstructive pulmonary disease with (acute) exacerbation: Secondary | ICD-10-CM | POA: Diagnosis not present

## 2020-02-15 DIAGNOSIS — R069 Unspecified abnormalities of breathing: Secondary | ICD-10-CM | POA: Diagnosis not present

## 2020-02-15 DIAGNOSIS — J09X2 Influenza due to identified novel influenza A virus with other respiratory manifestations: Secondary | ICD-10-CM | POA: Diagnosis not present

## 2020-02-15 DIAGNOSIS — R0689 Other abnormalities of breathing: Secondary | ICD-10-CM | POA: Diagnosis not present

## 2020-02-15 DIAGNOSIS — J449 Chronic obstructive pulmonary disease, unspecified: Secondary | ICD-10-CM | POA: Diagnosis present

## 2020-02-15 DIAGNOSIS — R059 Cough, unspecified: Secondary | ICD-10-CM | POA: Diagnosis not present

## 2020-02-15 DIAGNOSIS — R0600 Dyspnea, unspecified: Secondary | ICD-10-CM | POA: Diagnosis not present

## 2020-02-15 DIAGNOSIS — R0902 Hypoxemia: Secondary | ICD-10-CM | POA: Diagnosis not present

## 2020-02-15 DIAGNOSIS — K449 Diaphragmatic hernia without obstruction or gangrene: Secondary | ICD-10-CM | POA: Diagnosis not present

## 2020-02-15 DIAGNOSIS — J9621 Acute and chronic respiratory failure with hypoxia: Secondary | ICD-10-CM | POA: Diagnosis not present

## 2020-02-15 DIAGNOSIS — Z20822 Contact with and (suspected) exposure to covid-19: Secondary | ICD-10-CM | POA: Diagnosis not present

## 2020-02-15 DIAGNOSIS — R0602 Shortness of breath: Secondary | ICD-10-CM

## 2020-02-15 DIAGNOSIS — I10 Essential (primary) hypertension: Secondary | ICD-10-CM | POA: Diagnosis not present

## 2020-02-15 LAB — CBC
HCT: 40.8 % (ref 36.0–46.0)
Hemoglobin: 13.3 g/dL (ref 12.0–15.0)
MCH: 29.3 pg (ref 26.0–34.0)
MCHC: 32.6 g/dL (ref 30.0–36.0)
MCV: 89.9 fL (ref 80.0–100.0)
Platelets: 250 10*3/uL (ref 150–400)
RBC: 4.54 MIL/uL (ref 3.87–5.11)
RDW: 13.6 % (ref 11.5–15.5)
WBC: 6.8 10*3/uL (ref 4.0–10.5)
nRBC: 0 % (ref 0.0–0.2)

## 2020-02-15 LAB — BASIC METABOLIC PANEL
Anion gap: 12 (ref 5–15)
BUN: 14 mg/dL (ref 8–23)
CO2: 24 mmol/L (ref 22–32)
Calcium: 8.8 mg/dL — ABNORMAL LOW (ref 8.9–10.3)
Chloride: 103 mmol/L (ref 98–111)
Creatinine, Ser: 0.76 mg/dL (ref 0.44–1.00)
GFR, Estimated: >60 mL/min (ref >60)
Glucose, Bld: 103 mg/dL — ABNORMAL HIGH (ref 70–99)
Potassium: 3.5 mmol/L (ref 3.5–5.1)
Sodium: 139 mmol/L (ref 135–145)

## 2020-02-15 MED ORDER — IOHEXOL 350 MG/ML SOLN
75.0000 mL | Freq: Once | INTRAVENOUS | Status: AC | PRN
  Administered 2020-02-15: 75 mL via INTRAVENOUS

## 2020-02-15 NOTE — ED Triage Notes (Signed)
Pt in via EMS from home with c/o SOB. Pt dx'd with bronchitis since November and has been on 3 rounds of abx with little releif. Pt reports productive cough with thick green phlegm. Pt given 125mg  of solumedrol and a breathing treatment

## 2020-02-15 NOTE — ED Triage Notes (Signed)
Pt to ED via [facility] from home for shortness of breath. Pt states that she been on 3 round of antibiotics but has not gotten any better. Pt was given 125 mg of solumedrol in route by ems and a breathing treatment. Pt does not wear oxygen at home. Pt sPo2 91-92% in triage. Pt placed on 2 liters via [place]. Pt had negative COVID test yesterday at home.

## 2020-02-15 NOTE — ED Notes (Signed)
Pt in ct 

## 2020-02-15 NOTE — ED Notes (Signed)
Pt up to commode 

## 2020-02-15 NOTE — ED Provider Notes (Signed)
[facility] Emergency Department Provider Note   ____________________________________________   Event Date/Time   First MD Initiated Contact with Patient 02/15/20 2126     (approximate)  I have reviewed the triage vital signs and the nursing notes.   HISTORY  Chief Complaint Shortness of Breath    HPI Betty Ramirez is a 65 y.o. female with a past medical history of COPD who presents via EMS with complaints of shortness of breath.  Patient was found to be 91-92%'s in triage and placed on 2 L nasal cannula.  Patient states that she has been on 3 rounds of antibiotics that have improved symptoms but immediately gets worse after she runs out of these antibiotics.  Patient was given 125 Solu-Medrol and a breathing treatment in route which did not improve patient's symptoms.  Patient denies any exacerbating or relieving factors.  Patient does endorse dyspnea on exertion.  Patient currently denies any vision changes, tinnitus, difficulty speaking, facial droop, sore throat, chest pain, abdominal pain, nausea/vomiting/diarrhea, dysuria, or weakness/numbness/paresthesias in any extremity         Past Medical History:  Diagnosis Date  . COPD (chronic obstructive pulmonary disease) (HCC)     There are no problems to display for this patient.   Past Surgical History:  Procedure Laterality Date  . APPENDECTOMY    . BREAST BIOPSY Left 07/15/2014   Neg    Prior to Admission medications   Medication Sig Start Date End Date Taking? Authorizing Provider  cyclobenzaprine (FLEXERIL) 5 MG tablet Take 1-2 tablets 3 times daily as needed 03/04/18   Enid Derry, PA-C  ibuprofen (ADVIL,MOTRIN) 600 MG tablet Take 1 tablet (600 mg total) by mouth every 6 (six) hours as needed. 03/04/18   Enid Derry, PA-C  meloxicam (MOBIC) 15 MG tablet Take 1 tablet (15 mg total) by mouth daily. 01/09/15   Cuthriell, Delorise Royals, PA-C  ondansetron (ZOFRAN ODT) 4 MG disintegrating  tablet Take 1 tablet (4 mg total) by mouth every 8 (eight) hours as needed for nausea or vomiting. 03/04/17   Don Perking, [place], MD  ondansetron (ZOFRAN) 4 MG tablet Take 1 tablet (4 mg total) by mouth every 8 (eight) hours as needed for nausea or vomiting. 03/04/17   Governor Rooks, MD  predniSONE (DELTASONE) 10 MG tablet 40 mg daily for 4 more days 03/04/17   Governor Rooks, MD    Allergies Biaxin [clarithromycin] and Sulfa antibiotics  Family History  Problem Relation Age of Onset  . Breast cancer Sister 86    Social History Social History   Tobacco Use  . Smoking status: Former Games developer  . Smokeless tobacco: Never Used  Substance Use Topics  . Alcohol use: No    Review of Systems Constitutional: No fever/chills Eyes: No visual changes. ENT: No sore throat. Cardiovascular: Denies chest pain. Respiratory: Endorses shortness of breath. Gastrointestinal: No abdominal pain.  No nausea, no vomiting.  No diarrhea. Genitourinary: Negative for dysuria. Musculoskeletal: Negative for acute arthralgias Skin: Negative for rash. Neurological: Negative for headaches, weakness/numbness/paresthesias in any extremity Psychiatric: Negative for suicidal ideation/homicidal ideation   ____________________________________________   PHYSICAL EXAM:  VITAL SIGNS: ED Triage Vitals  Enc Vitals Group     BP 02/15/20 0957 (!) 144/72     Pulse Rate 02/15/20 0957 95     Resp 02/15/20 0957 18     Temp 02/15/20 1001 100.1 F (37.8 C)     Temp Source 02/15/20 1001 Oral     SpO2 02/15/20 0957 95 %  Weight 02/15/20 0958 142 lb (64.4 kg)     Height 02/15/20 0958 5\' 2"  (1.575 m)     Head Circumference --      Peak Flow --      Pain Score 02/15/20 0958 9     Pain Loc --      Pain Edu? --      Excl. in GC? --    Constitutional: Alert and oriented. Well appearing and in no acute distress. Eyes: Conjunctivae are normal. PERRL. Head: Atraumatic. Nose: No congestion/rhinnorhea. Mouth/Throat:  Mucous membranes are moist. Neck: No stridor Cardiovascular: Grossly normal heart sounds.  Good peripheral circulation. Respiratory: 2 L nasal cannula in place.  Normal respiratory effort.  No retractions.  Clear to auscultation bilaterally Gastrointestinal: Soft and nontender. No distention. Musculoskeletal: No obvious deformities Neurologic:  Normal speech and language. No gross focal neurologic deficits are appreciated. Skin:  Skin is warm and dry. No rash noted. Psychiatric: Mood and affect are normal. Speech and behavior are normal.  ____________________________________________   LABS (all labs ordered are listed, but only abnormal results are displayed)  Labs Reviewed  BASIC METABOLIC PANEL - Abnormal; Notable for the following components:      Result Value   Glucose, Bld 103 (*)    Calcium 8.8 (*)    All other components within normal limits  RESP PANEL BY RT-PCR (FLU A&B, COVID) [id number]  CBC  TROPONIN I (HIGH SENSITIVITY)   ____________________________________________  EKG  ED ECG REPORT I, 04/14/20, the attending physician, personally viewed and interpreted this ECG.  Date: 02/15/2020 EKG Time: 0957 Rate: 90 Rhythm: normal sinus rhythm QRS Axis: normal Intervals: normal ST/T Wave abnormalities: normal Narrative Interpretation: no evidence of acute ischemia  ____________________________________________  RADIOLOGY  ED MD interpretation: 2 view x-ray of the chest showed no evidence of acute abnormality including no pneumonia, pneumothorax, or widened mediastinum  Official radiology report(s): DG Chest 2 View  Result Date: 02/15/2020 CLINICAL DATA:  Dyspnea EXAM: CHEST - 2 VIEW COMPARISON:  07/20/2010 chest radiograph. FINDINGS: Stable cardiomediastinal silhouette with normal heart size. No pneumothorax. No pleural effusion. Lungs appear clear, with no acute consolidative airspace disease and no pulmonary edema. Surgical clips seen in the upper abdomen on  the lateral view. IMPRESSION: No active cardiopulmonary disease. Electronically Signed   By: 09/19/2010 M.D.   On: 02/15/2020 10:53    ____________________________________________   PROCEDURES  Procedure(s) performed (including Critical Care):  Procedures   ____________________________________________   INITIAL IMPRESSION / ASSESSMENT AND PLAN / ED COURSE  As part of my medical decision making, I reviewed the following data within the electronic MEDICAL RECORD NUMBER Nursing notes reviewed and incorporated, Labs reviewed, EKG interpreted, Old chart reviewed, Radiograph reviewed and Notes from prior ED visits reviewed and incorporated        The patient is suffering from shortness of breath, but the immediate cause is not apparent.  Potential causes considered include, but are not limited to, asthma or COPD, congestive heart failure, pulmonary embolism, pneumothorax, coronary syndrome, pneumonia, and pleural effusion.  Care of this patient will be signed out to the oncoming physician at the end of my shift.  All pertinent patient information conveyed and all questions answered.  All further care and disposition decisions will be made by the oncoming physician.      ____________________________________________   FINAL CLINICAL IMPRESSION(S) / ED DIAGNOSES  Final diagnoses:  SOB (shortness of breath)     ED Discharge Orders    None  Note:  This document was prepared using Dragon voice recognition software and may include unintentional dictation errors.   Naaman Plummer, MD 02/15/20 [phone]

## 2020-02-16 ENCOUNTER — Encounter: Payer: Self-pay | Admitting: Internal Medicine

## 2020-02-16 DIAGNOSIS — J9621 Acute and chronic respiratory failure with hypoxia: Secondary | ICD-10-CM | POA: Diagnosis not present

## 2020-02-16 DIAGNOSIS — J449 Chronic obstructive pulmonary disease, unspecified: Secondary | ICD-10-CM | POA: Diagnosis present

## 2020-02-16 LAB — CREATININE, SERUM
Creatinine, Ser: 0.6 mg/dL (ref 0.44–1.00)
GFR, Estimated: >60 mL/min (ref >60)

## 2020-02-16 LAB — CBC
HCT: 41.1 % (ref 36.0–46.0)
Hemoglobin: 13.3 g/dL (ref 12.0–15.0)
MCH: 29.8 pg (ref 26.0–34.0)
MCHC: 32.4 g/dL (ref 30.0–36.0)
MCV: 92.2 fL (ref 80.0–100.0)
Platelets: 248 10*3/uL (ref 150–400)
RBC: 4.46 MIL/uL (ref 3.87–5.11)
RDW: 13.7 % (ref 11.5–15.5)
WBC: 4.9 10*3/uL (ref 4.0–10.5)
nRBC: 0 % (ref 0.0–0.2)

## 2020-02-16 LAB — RESP PANEL BY RT-PCR (FLU A&B, COVID) ARPGX2
Influenza A by PCR: POSITIVE — AB
Influenza B by PCR: NEGATIVE
SARS Coronavirus 2 by RT PCR: NEGATIVE

## 2020-02-16 LAB — PROCALCITONIN: Procalcitonin: <0.1 ng/mL

## 2020-02-16 LAB — TROPONIN I (HIGH SENSITIVITY): Troponin I (High Sensitivity): 4 ng/L (ref <18)

## 2020-02-16 MED ORDER — METHYLPREDNISOLONE SODIUM SUCC 125 MG IJ SOLR
125.0000 mg | Freq: Once | INTRAMUSCULAR | Status: AC
  Administered 2020-02-16: 125 mg via INTRAVENOUS
  Filled 2020-02-16: qty 2

## 2020-02-16 MED ORDER — PREDNISONE 20 MG PO TABS: 40.0000 mg | ORAL_TABLET | Freq: Every day | ORAL | 0 refills | Status: AC

## 2020-02-16 MED ORDER — METHYLPREDNISOLONE SODIUM SUCC 125 MG IJ SOLR
60.0000 mg | Freq: Four times a day (QID) | INTRAMUSCULAR | Status: DC
Start: 2020-02-16 — End: 2020-02-16
  Administered 2020-02-16 (×2): 60 mg via INTRAVENOUS
  Filled 2020-02-16 (×2): qty 2

## 2020-02-16 MED ORDER — SODIUM CHLORIDE 0.45 % IV SOLN: INTRAVENOUS | Status: DC

## 2020-02-16 MED ORDER — IPRATROPIUM-ALBUTEROL 0.5-2.5 (3) MG/3ML IN SOLN
3.0000 mL | Freq: Four times a day (QID) | RESPIRATORY_TRACT | Status: DC
  Administered 2020-02-16 (×2): 3 mL via RESPIRATORY_TRACT
  Filled 2020-02-16 (×2): qty 3

## 2020-02-16 MED ORDER — IPRATROPIUM-ALBUTEROL 0.5-2.5 (3) MG/3ML IN SOLN
3.0000 mL | Freq: Once | RESPIRATORY_TRACT | Status: AC
  Administered 2020-02-16: 3 mL via RESPIRATORY_TRACT
  Filled 2020-02-16: qty 3

## 2020-02-16 MED ORDER — PNEUMOCOCCAL VAC POLYVALENT 25 MCG/0.5ML IJ INJ: 0.5000 mL | INJECTION | INTRAMUSCULAR | Status: DC

## 2020-02-16 MED ORDER — OSELTAMIVIR PHOSPHATE 75 MG PO CAPS: 75.0000 mg | ORAL_CAPSULE | Freq: Two times a day (BID) | ORAL | 0 refills | Status: AC

## 2020-02-16 MED ORDER — SODIUM CHLORIDE 0.9 % IV SOLN
1.0000 g | INTRAVENOUS | Status: DC
  Administered 2020-02-16: 1 g via INTRAVENOUS
  Filled 2020-02-16 (×2): qty 10

## 2020-02-16 MED ORDER — ENOXAPARIN SODIUM 40 MG/0.4ML ~~LOC~~ SOLN
40.0000 mg | SUBCUTANEOUS | Status: DC
  Administered 2020-02-16: 40 mg via SUBCUTANEOUS
  Filled 2020-02-16: qty 0.4

## 2020-02-16 MED ORDER — ALBUTEROL SULFATE HFA 108 (90 BASE) MCG/ACT IN AERS: 2.0000 | INHALATION_SPRAY | Freq: Four times a day (QID) | RESPIRATORY_TRACT | 2 refills | Status: AC | PRN

## 2020-02-16 MED ORDER — PREDNISONE 20 MG PO TABS: 40.0000 mg | ORAL_TABLET | Freq: Every day | ORAL | Status: DC

## 2020-02-16 MED ORDER — OSELTAMIVIR PHOSPHATE 75 MG PO CAPS
75.0000 mg | ORAL_CAPSULE | Freq: Two times a day (BID) | ORAL | Status: DC
  Administered 2020-02-16: 75 mg via ORAL
  Filled 2020-02-16 (×2): qty 1

## 2020-02-16 MED ORDER — INFLUENZA VAC A&B SA ADJ QUAD 0.5 ML IM PRSY: 0.5000 mL | PREFILLED_SYRINGE | INTRAMUSCULAR | Status: DC

## 2020-02-16 MED ORDER — ALBUTEROL SULFATE (2.5 MG/3ML) 0.083% IN NEBU: 2.5000 mg | INHALATION_SOLUTION | RESPIRATORY_TRACT | Status: DC | PRN

## 2020-02-16 NOTE — Discharge Summary (Signed)
[facility] -  at [facility]   PATIENT NAME: Betty Ramirez    MR#:  [id number]  DATE OF BIRTH:  1955-09-20  DATE OF ADMISSION:  02/15/2020 ADMITTING PHYSICIAN: Rometta Emery, MD  DATE OF DISCHARGE: 02/16/2020  PRIMARY CARE PHYSICIAN: Leanna Sato, MD    ADMISSION DIAGNOSIS:  SOB (shortness of breath) [R06.02] Hypoxia [R09.02] COPD exacerbation (HCC) [J44.1] Acute on chronic respiratory failure with hypoxemia (HCC) [J96.21]  DISCHARGE DIAGNOSIS:  Acute respiratory distress due to COPD exacerbation  Influenza A  SECONDARY DIAGNOSIS:   Past Medical History:  Diagnosis Date  . COPD (chronic obstructive pulmonary disease) [facility])     HOSPITAL COURSE:  Betty Ramirez is a 65 y.o. female with medical history significant of COPD, remote tobacco abuse, who has been having significant shortness of breath on and off for a while.  Diagnosed with COPD but not on significant long-acting treatment.  Patient came in complaining of progressive shortness of breath in the last 3 days.  #1 acute respiratory failure: secondary to COPD exacerbation. -- IV Solu-Medrol--change to po steroids for 4 days --cont DuoNeb, as needed albuterol --procalcitonin <0.10--d/c abxs. Pt has dry cough, no fever, WBC normal --CTA chest x-ray not consistent with Covid pneumonia.   --CXR shows emphysema. No pneumonia.  #2 INfluenza A  po tamiflu started for 5 days  Pt ambulated with RN. Her oxygen remained >90%. D/c to home. Pt agreeable  DVT prophylaxis: Lovenox Code Status: Full code Family Communication: No family at bedside Disposition Plan: Home Consults called: None Admission status: Observation CONSULTS OBTAINED:    DRUG ALLERGIES:   Allergies  Allergen Reactions  . Biaxin [Clarithromycin]   . Sulfa Antibiotics     DISCHARGE MEDICATIONS:   Allergies as of 02/16/2020      Reactions   Biaxin [clarithromycin]    Sulfa Antibiotics       Medication List     STOP taking these medications   ondansetron 4 MG tablet Commonly known as: Zofran     TAKE these medications   albuterol 108 (90 Base) MCG/ACT inhaler Commonly known as: VENTOLIN HFA Inhale 2 puffs into the lungs every 6 (six) hours as needed for wheezing or shortness of breath.   cyclobenzaprine 5 MG tablet Commonly known as: FLEXERIL Take 1-2 tablets 3 times daily as needed   ibuprofen 600 MG tablet Commonly known as: ADVIL Take 1 tablet (600 mg total) by mouth every 6 (six) hours as needed.   meloxicam 15 MG tablet Commonly known as: MOBIC Take 1 tablet (15 mg total) by mouth daily.   ondansetron 4 MG disintegrating tablet Commonly known as: Zofran ODT Take 1 tablet (4 mg total) by mouth every 8 (eight) hours as needed for nausea or vomiting.   oseltamivir 75 MG capsule Commonly known as: TAMIFLU Take 1 capsule (75 mg total) by mouth 2 (two) times daily for 5 days.   predniSONE 20 MG tablet Commonly known as: DELTASONE Take 2 tablets (40 mg total) by mouth daily with breakfast for 4 days. Start taking on: February 17, 2020 What changed:   medication strength  how much to take  how to take this  when to take this  additional instructions       If you experience worsening of your admission symptoms, develop shortness of breath, life threatening emergency, suicidal or homicidal thoughts you must seek medical attention immediately by calling 911 or calling your MD immediately  if symptoms less severe.  You Must  read complete instructions/literature along with all the possible adverse reactions/side effects for all the Medicines you take and that have been prescribed to you. Take any new Medicines after you have completely understood and accept all the possible adverse reactions/side effects.   Please note  You were cared for by a hospitalist during your hospital stay. If you have any questions about your discharge medications or the care you received while you  were in the hospital after you are discharged, you can call the unit and asked to speak with the hospitalist on call if the hospitalist that took care of you is not available. Once you are discharged, your primary care physician will handle any further medical issues. Please note that NO REFILLS for any discharge medications will be authorized once you are discharged, as it is imperative that you return to your primary care physician (or establish a relationship with a primary care physician if you do not have one) for your aftercare needs so that they can reassess your need for medications and monitor your lab values. Today   SUBJECTIVE   Some cough and flu like symptoms  VITAL SIGNS:  Blood pressure (!) 144/72, pulse 84, temperature 97.7 F (36.5 C), temperature source Oral, resp. rate 17, height 5\' 2"  (1.575 m), weight 63.6 kg, SpO2 93 %.  I/O:    Intake/Output Summary (Last 24 hours) at 02/16/2020 1245 Last data filed at 02/16/2020 0408 Gross per 24 hour  Intake 350.57 ml  Output --  Net 350.57 ml    PHYSICAL EXAMINATION:  GENERAL:  65 y.o.-year-old patient lying in the bed with no acute distress.  LUNGS: Distant but Normal breath sounds bilaterally, no wheezing, rales,rhonchi or crepitation. No use of accessory muscles of respiration.  CARDIOVASCULAR: S1, S2 normal. No murmurs, rubs, or gallops.  ABDOMEN: Soft, non-tender, non-distended. Bowel sounds present. No organomegaly or mass.  EXTREMITIES: No pedal edema, cyanosis, or clubbing.  NEUROLOGIC: Cranial nerves II through XII are intact. Muscle strength 5/5 in all extremities. Sensation intact. Gait not checked.  PSYCHIATRIC: The patient is alert and oriented x 3.  SKIN: No obvious rash, lesion, or ulcer.   DATA REVIEW:   CBC  Recent Labs  Lab 02/16/20 0055  WBC 4.9  HGB 13.3  HCT 41.1  PLT 248    Chemistries  Recent Labs  Lab 02/15/20 1002 02/16/20 0055  NA 139  --   K 3.5  --   CL 103  --   CO2 24  --    GLUCOSE 103*  --   BUN 14  --   CREATININE 0.76 0.60  CALCIUM 8.8*  --     Microbiology Results   Recent Results (from the past 240 hour(s))  Resp Panel by RT-PCR (Flu A&B, Covid) Nasopharyngeal Swab     Status: Abnormal   Collection Time: 02/15/20 11:32 PM   Specimen: Nasopharyngeal Swab; Nasopharyngeal(NP) swabs in vial transport medium  Result Value Ref Range Status   SARS Coronavirus 2 by RT PCR NEGATIVE NEGATIVE Final    Comment: (NOTE) SARS-CoV-2 target nucleic acids are NOT DETECTED.  The SARS-CoV-2 RNA is generally detectable in upper respiratory specimens during the acute phase of infection. The lowest concentration of SARS-CoV-2 viral copies this assay can detect is 138 copies/mL. A negative result does not preclude SARS-Cov-2 infection and should not be used as the sole basis for treatment or other patient management decisions. A negative result may occur with  improper specimen collection/handling, submission of specimen other than  nasopharyngeal swab, presence of viral mutation(s) within the areas targeted by this assay, and inadequate number of viral copies(<138 copies/mL). A negative result must be combined with clinical observations, patient history, and epidemiological information. The expected result is Negative.  Fact Sheet for Patients:  EntrepreneurPulse.com.au  Fact Sheet for Healthcare Providers:  IncredibleEmployment.be  This test is no t yet approved or cleared by the [place] FDA and  has been authorized for detection and/or diagnosis of SARS-CoV-2 by FDA under an Emergency Use Authorization (EUA). This EUA will remain  in effect (meaning this test can be used) for the duration of the COVID-19 declaration under Section 564(b)(1) of the Act, 21 U.S.C.section 360bbb-3(b)(1), unless the authorization is terminated  or revoked sooner.       Influenza A by PCR POSITIVE (A) NEGATIVE Final   Influenza B by PCR  NEGATIVE NEGATIVE Final    Comment: (NOTE) The Xpert Xpress SARS-CoV-2/FLU/RSV plus assay is intended as an aid in the diagnosis of influenza from Nasopharyngeal swab specimens and should not be used as a sole basis for treatment. Nasal washings and aspirates are unacceptable for Xpert Xpress SARS-CoV-2/FLU/RSV testing.  Fact Sheet for Patients: EntrepreneurPulse.com.au  Fact Sheet for Healthcare Providers: IncredibleEmployment.be  This test is not yet approved or cleared by the [place] FDA and has been authorized for detection and/or diagnosis of SARS-CoV-2 by FDA under an Emergency Use Authorization (EUA). This EUA will remain in effect (meaning this test can be used) for the duration of the COVID-19 declaration under Section 564(b)(1) of the Act, 21 U.S.C. section 360bbb-3(b)(1), unless the authorization is terminated or revoked.  Performed at [facility], [address] [place], [place] [address]     RADIOLOGY:  DG Chest 2 View  Result Date: 02/15/2020 CLINICAL DATA:  Dyspnea EXAM: CHEST - 2 VIEW COMPARISON:  07/20/2010 chest radiograph. FINDINGS: Stable cardiomediastinal silhouette with normal heart size. No pneumothorax. No pleural effusion. Lungs appear clear, with no acute consolidative airspace disease and no pulmonary edema. Surgical clips seen in the upper abdomen on the lateral view. IMPRESSION: No active cardiopulmonary disease. Electronically Signed   By: Ilona Sorrel M.D.   On: 02/15/2020 10:53   CT Angio Chest PE W/Cm &/Or Wo Cm  Result Date: 02/15/2020 CLINICAL DATA:  Dyspnea, bronchitis, productive cough EXAM: CT ANGIOGRAPHY CHEST WITH CONTRAST TECHNIQUE: Multidetector CT imaging of the chest was performed using the standard protocol during bolus administration of intravenous contrast. Multiplanar CT image reconstructions and MIPs were obtained to evaluate the vascular anatomy. CONTRAST:  33mL OMNIPAQUE IOHEXOL 350  MG/ML SOLN COMPARISON:  03/04/2018 FINDINGS: Cardiovascular: There is adequate opacification of the pulmonary arterial tree. There is no intraluminal filling defect identified to suggest acute pulmonary embolism. The central pulmonary arteries are of normal caliber. No significant coronary artery calcification. Global cardiac size within normal limits. Trace pericardial effusion is present. 2.8 cm pericardial cyst noted. The thoracic aorta demonstrates mild atheromatous plaque within the arch. No aortic aneurysm. Mediastinum/Nodes: The visualized thyroid is unremarkable. No pathologic thoracic adenopathy. Small hiatal hernia. Esophagus is unremarkable. Lungs/Pleura: Severe centrilobular emphysema. Mild pulmonary hyperinflation. No focal pulmonary nodules or infiltrates. Mild scarring within the apices bilaterally. No pneumothorax or pleural effusion. Central airways are widely patent. Upper Abdomen: Cholecystectomy has been performed. No acute abnormality. Musculoskeletal: No lytic or blastic bone lesions are identified. No acute bone abnormality. Review of the MIP images confirms the above findings. IMPRESSION: No pulmonary embolism. Severe centrilobular emphysema with mild associated pulmonary hyperinflation. Aortic  Atherosclerosis (ICD10-I70.0). Electronically Signed   By: Helyn Numbers MD   On: 02/15/2020 23:31     CODE STATUS:     Code Status Orders  (From admission, onward)         Start     Ordered   02/16/20 0044  Full code  Continuous        02/16/20 0043        Code Status History    This patient has a current code status but no historical code status.   Advance Care Planning Activity       TOTAL TIME TAKING CARE OF THIS PATIENT: *35* minutes.    Enedina Finner M.D  [facility]    CC: Primary care physician; Leanna Sato, MD

## 2020-02-16 NOTE — Progress Notes (Signed)
Betty Ramirez  A and O x 4. VSS. Pt tolerating diet well. No complaints of pain or nausea. IV removed intact, prescriptions given. Pt voiced understanding of discharge instructions with no further questions. Pt discharged via wheelchair with axillary.    Allergies as of 02/16/2020      Reactions   Biaxin [clarithromycin]    Sulfa Antibiotics       Medication List    STOP taking these medications   ondansetron 4 MG tablet Commonly known as: Zofran     TAKE these medications   albuterol 108 (90 Base) MCG/ACT inhaler Commonly known as: VENTOLIN HFA Inhale 2 puffs into the lungs every 6 (six) hours as needed for wheezing or shortness of breath.   cyclobenzaprine 5 MG tablet Commonly known as: FLEXERIL Take 1-2 tablets 3 times daily as needed   ibuprofen 600 MG tablet Commonly known as: ADVIL Take 1 tablet (600 mg total) by mouth every 6 (six) hours as needed.   meloxicam 15 MG tablet Commonly known as: MOBIC Take 1 tablet (15 mg total) by mouth daily.   ondansetron 4 MG disintegrating tablet Commonly known as: Zofran ODT Take 1 tablet (4 mg total) by mouth every 8 (eight) hours as needed for nausea or vomiting.   oseltamivir 75 MG capsule Commonly known as: TAMIFLU Take 1 capsule (75 mg total) by mouth 2 (two) times daily for 5 days.   predniSONE 20 MG tablet Commonly known as: DELTASONE Take 2 tablets (40 mg total) by mouth daily with breakfast for 4 days. Start taking on: February 17, 2020 What changed:   medication strength  how much to take  how to take this  when to take this  additional instructions       Vitals:   02/16/20 1118 02/16/20 1329  BP: (!) 144/72   Pulse: 84   Resp: 17   Temp: 97.7 F (36.5 C)   SpO2: 93% 93%    Suzzanne Cloud

## 2020-02-16 NOTE — Progress Notes (Signed)
PT Cancellation Note  Patient Details Name: Betty Ramirez MRN: [id number] DOB: [birth year]   Cancelled Treatment:    Reason Eval/Treat Not Completed: PT screened, no needs identified, will sign off;Other (comment) Per handoff communication from rounds, patient is independent with mobility with no acute PT needs identified at this time. PT will sign off.   Donna Bernard, PT, MPT  Ina Homes 02/16/2020, 11:10 AM

## 2020-02-16 NOTE — Progress Notes (Signed)
SATURATION QUALIFICATIONS: (This note is used to comply with regulatory documentation for home oxygen)  Patient Saturations on Room Air at Rest = 97%  Patient Saturations on Room Air while Ambulating = 93%  Please briefly explain why patient needs home oxygen: 

## 2020-02-16 NOTE — ED Notes (Signed)
Pt states she would like to hold breathing treatments while she is trying to sleep.

## 2020-02-16 NOTE — ED Provider Notes (Signed)
-----------------------------------------   12:04 AM on 02/16/2020 -----------------------------------------  Care assumed from Dr. Irwin Brakeman.  In summary, this is a 65 year old female with COPD who presents with several months history of shortness of breath.  She has been treated with 3 rounds of antibiotics, transiently improves then with return of shortness of breath, worse tonight.  She is requiring oxygen tonight which is acute.  CTA chest negative for PE but revealing for severe emphysema.  Will administer IV Solu-Medrol, DuoNeb and discuss with hospitalist services for admission.  CTA chest interpreted per Dr. Ramiro Harvest:  No pulmonary embolism.    Severe centrilobular emphysema with mild associated pulmonary  hyperinflation.      Irean Hong, MD 02/16/20 [phone]

## 2020-02-16 NOTE — H&P (Signed)
History and Physical   Betty Ramirez YQM:[id number] DOB: Jan 17, 1956 DOA: 02/15/2020  Referring MD/NP/PA: Dr. Dolores Frame  PCP: Leanna Sato, MD   Outpatient Specialists: None  Patient coming from: Home  Chief Complaint: Shortness of breath  HPI: Betty Ramirez is a 65 y.o. female with medical history significant of COPD, remote tobacco abuse, who has been having significant shortness of breath on and off for a while.  Diagnosed with COPD but not on significant long-acting treatment.  Patient came in complaining of progressive shortness of breath in the last 3 days.  Associated with wheezing.  Patient noted to have significant hypoxemia in the ER now requiring 2 L of oxygen.  She has expiratory wheezing and evidence of her COPD exacerbation.  She is apparently been having issues since November.  She was being treated for acute bronchitis with antibiotics.  She has had at least 3 rounds of antibiotics so far with no significant relief.  She has significant cough with thick green phlegm.  In the ER she has so far received 125 mg of Solu-Medrol felt a little better but again is requiring 2 L of oxygen so she is being admitted to the hospital with acute on chronic respiratory failure with COPD exacerbation.  No evidence of PE on CT.  COVID-19 screen currently pending..  ED Course: Temperature 100.1 blood pressure 149/78 pulse 95 respirate of 20 oxygen sats 93% 2 L.  CBC and chemistry largely within normal except for calcium 8.8.  COVID-19 screen is pending.  Chest x-ray showed no acute findings.  CT angiogram of the chest showed no evidence of PE but significant COPD.  Patient is therefore being admitted to the hospital for evaluation and treatment of acute respiratory failure with hypoxia most likely secondary to COPD exacerbation.  Review of Systems: As per HPI otherwise 10 point review of systems negative.    Past Medical History:  Diagnosis Date  . COPD (chronic obstructive pulmonary disease)  (HCC)     Past Surgical History:  Procedure Laterality Date  . APPENDECTOMY    . BREAST BIOPSY Left 07/15/2014   Neg     reports that she has quit smoking. She has never used smokeless tobacco. She reports that she does not drink alcohol. No history on file for drug use.  Allergies  Allergen Reactions  . Biaxin [Clarithromycin]   . Sulfa Antibiotics     Family History  Problem Relation Age of Onset  . Breast cancer Sister 32     Prior to Admission medications   Medication Sig Start Date End Date Taking? Authorizing Provider  cyclobenzaprine (FLEXERIL) 5 MG tablet Take 1-2 tablets 3 times daily as needed 03/04/18   Enid Derry, PA-C  ibuprofen (ADVIL,MOTRIN) 600 MG tablet Take 1 tablet (600 mg total) by mouth every 6 (six) hours as needed. 03/04/18   Enid Derry, PA-C  meloxicam (MOBIC) 15 MG tablet Take 1 tablet (15 mg total) by mouth daily. 01/09/15   Cuthriell, Delorise Royals, PA-C  ondansetron (ZOFRAN ODT) 4 MG disintegrating tablet Take 1 tablet (4 mg total) by mouth every 8 (eight) hours as needed for nausea or vomiting. 03/04/17   Don Perking, [place], MD  ondansetron (ZOFRAN) 4 MG tablet Take 1 tablet (4 mg total) by mouth every 8 (eight) hours as needed for nausea or vomiting. 03/04/17   Governor Rooks, MD  predniSONE (DELTASONE) 10 MG tablet 40 mg daily for 4 more days 03/04/17   Governor Rooks, MD    Physical Exam:  Vitals:   02/15/20 2052 02/15/20 2204 02/15/20 2300 02/15/20 2330  BP: 139/79 140/79 (!) 142/73 (!) 147/74  Pulse: 73 73 70 83  Resp: 20 14 13 15   Temp:      TempSrc:      SpO2: 96% 100% 99% 100%  Weight:      Height:          Constitutional: Anxious, mild respiratory distress Vitals:   02/15/20 2052 02/15/20 2204 02/15/20 2300 02/15/20 2330  BP: 139/79 140/79 (!) 142/73 (!) 147/74  Pulse: 73 73 70 83  Resp: 20 14 13 15   Temp:      TempSrc:      SpO2: 96% 100% 99% 100%  Weight:      Height:       Eyes: PERRL, lids and conjunctivae  normal ENMT: Mucous membranes are moist. Posterior pharynx clear of any exudate or lesions.Normal dentition.  Neck: normal, supple, no masses, no thyromegaly Respiratory: Decreased air entry bilaterally with mild expiratory wheezing, no crackles no rhonchi normal respiratory effort. No accessory muscle use.  Cardiovascular: Regular rate and rhythm, no murmurs / rubs / gallops. No extremity edema. 2+ pedal pulses. No carotid bruits.  Abdomen: no tenderness, no masses palpated. No hepatosplenomegaly. Bowel sounds positive.  Musculoskeletal: no clubbing / cyanosis. No joint deformity upper and lower extremities. Good ROM, no contractures. Normal muscle tone.  Skin: no rashes, lesions, ulcers. No induration Neurologic: CN 2-12 grossly intact. Sensation intact, DTR normal. Strength 5/5 in all 4.  Psychiatric: Normal judgment and insight. Alert and oriented x 3.  Anxious mood.     Labs on Admission: I have personally reviewed following labs and imaging studies  CBC: Recent Labs  Lab 02/15/20 1002  WBC 6.8  HGB 13.3  HCT 40.8  MCV 89.9  PLT 250   Basic Metabolic Panel: Recent Labs  Lab 02/15/20 1002  NA 139  K 3.5  CL 103  CO2 24  GLUCOSE 103*  BUN 14  CREATININE 0.76  CALCIUM 8.8*   GFR: Estimated Creatinine Clearance: 61.8 mL/min (by C-G formula based on SCr of 0.76 mg/dL). Liver Function Tests: No results for input(s): AST, ALT, ALKPHOS, BILITOT, PROT, ALBUMIN in the last 168 hours. No results for input(s): LIPASE, AMYLASE in the last 168 hours. No results for input(s): AMMONIA in the last 168 hours. Coagulation Profile: No results for input(s): INR, PROTIME in the last 168 hours. Cardiac Enzymes: No results for input(s): CKTOTAL, CKMB, CKMBINDEX, TROPONINI in the last 168 hours. BNP (last 3 results) No results for input(s): PROBNP in the last 8760 hours. HbA1C: No results for input(s): HGBA1C in the last 72 hours. CBG: No results for input(s): GLUCAP in the last 168  hours. Lipid Profile: No results for input(s): CHOL, HDL, LDLCALC, TRIG, CHOLHDL, LDLDIRECT in the last 72 hours. Thyroid Function Tests: No results for input(s): TSH, T4TOTAL, FREET4, T3FREE, THYROIDAB in the last 72 hours. Anemia Panel: No results for input(s): VITAMINB12, FOLATE, FERRITIN, TIBC, IRON, RETICCTPCT in the last 72 hours. Urine analysis:    Component Value Date/Time   COLORURINE YELLOW (A) 03/04/2017 1412   APPEARANCEUR CLEAR (A) 03/04/2017 1412   LABSPEC 1.005 03/04/2017 1412   PHURINE 6.0 03/04/2017 1412   GLUCOSEU NEGATIVE 03/04/2017 1412   HGBUR SMALL (A) 03/04/2017 1412   BILIRUBINUR NEGATIVE 03/04/2017 1412   KETONESUR 20 (A) 03/04/2017 1412   PROTEINUR NEGATIVE 03/04/2017 1412   NITRITE NEGATIVE 03/04/2017 1412   LEUKOCYTESUR NEGATIVE 03/04/2017 1412   Sepsis Labs: @  LABRCNTIP(procalcitonin:4,lacticidven:4) )No results found for this or any previous visit (from the past 240 hour(s)).   Radiological Exams on Admission: DG Chest 2 View  Result Date: 02/15/2020 CLINICAL DATA:  Dyspnea EXAM: CHEST - 2 VIEW COMPARISON:  07/20/2010 chest radiograph. FINDINGS: Stable cardiomediastinal silhouette with normal heart size. No pneumothorax. No pleural effusion. Lungs appear clear, with no acute consolidative airspace disease and no pulmonary edema. Surgical clips seen in the upper abdomen on the lateral view. IMPRESSION: No active cardiopulmonary disease. Electronically Signed   By: Delbert Phenix M.D.   On: 02/15/2020 10:53   CT Angio Chest PE W/Cm &/Or Wo Cm  Result Date: 02/15/2020 CLINICAL DATA:  Dyspnea, bronchitis, productive cough EXAM: CT ANGIOGRAPHY CHEST WITH CONTRAST TECHNIQUE: Multidetector CT imaging of the chest was performed using the standard protocol during bolus administration of intravenous contrast. Multiplanar CT image reconstructions and MIPs were obtained to evaluate the vascular anatomy. CONTRAST:  75mL OMNIPAQUE IOHEXOL 350 MG/ML SOLN COMPARISON:   03/04/2018 FINDINGS: Cardiovascular: There is adequate opacification of the pulmonary arterial tree. There is no intraluminal filling defect identified to suggest acute pulmonary embolism. The central pulmonary arteries are of normal caliber. No significant coronary artery calcification. Global cardiac size within normal limits. Trace pericardial effusion is present. 2.8 cm pericardial cyst noted. The thoracic aorta demonstrates mild atheromatous plaque within the arch. No aortic aneurysm. Mediastinum/Nodes: The visualized thyroid is unremarkable. No pathologic thoracic adenopathy. Small hiatal hernia. Esophagus is unremarkable. Lungs/Pleura: Severe centrilobular emphysema. Mild pulmonary hyperinflation. No focal pulmonary nodules or infiltrates. Mild scarring within the apices bilaterally. No pneumothorax or pleural effusion. Central airways are widely patent. Upper Abdomen: Cholecystectomy has been performed. No acute abnormality. Musculoskeletal: No lytic or blastic bone lesions are identified. No acute bone abnormality. Review of the MIP images confirms the above findings. IMPRESSION: No pulmonary embolism. Severe centrilobular emphysema with mild associated pulmonary hyperinflation. Aortic Atherosclerosis (ICD10-I70.0). Electronically Signed   By: Helyn Numbers MD   On: 02/15/2020 23:31    EKG: Independently reviewed.  Normal sinus rhythm no significant ST changes.  Assessment/Plan Principal Problem:   Acute on chronic respiratory failure with hypoxemia (HCC) Active Problems:   COPD (chronic obstructive pulmonary disease) (HCC)     #1 acute on chronic respiratory failure: secondary to COPD exacerbation.  Patient will be admitted.  Initiate the COPD gold protocol.  IV Solu-Medrol, DuoNeb, as needed albuterol, antibiotics as well as the oxygen.  At this point she is on 2 L of oxygen.  We will aim to titrate her off of that.  She is already quit smoking and should be encouraged to stay off tobacco.   CTA chest x-ray not consistent with Covid pneumonia.  We will still follow-up results.  #2 COPD: Probably advancing despite quitting smoking.  Patient will ultimately need to follow-up with pulmonology.  We will treat acute disease and hopefully make a referral.   DVT prophylaxis: Lovenox Code Status: Full code Family Communication: No family at bedside Disposition Plan: Home Consults called: None Admission status: Observation  Severity of Illness: The appropriate patient status for this patient is OBSERVATION. Observation status is judged to be reasonable and necessary in order to provide the required intensity of service to ensure the patient's safety. The patient's presenting symptoms, physical exam findings, and initial radiographic and laboratory data in the context of their medical condition is felt to place them at decreased risk for further clinical deterioration. Furthermore, it is anticipated that the patient will be medically stable for discharge  from the hospital within 2 midnights of admission. The following factors support the patient status of observation.   " The patient's presenting symptoms include shortness of breath and cough. " The physical exam findings include expiratory wheezing. " The initial radiographic and laboratory data are no acute findings.     Barbette Merino MD [facility] Pager [phone]  If 7PM-7AM, please contact night-coverage [email or web address] Password [facility]  02/16/2020, 12:33 AM

## 2020-02-26 DIAGNOSIS — J441 Chronic obstructive pulmonary disease with (acute) exacerbation: Secondary | ICD-10-CM | POA: Diagnosis not present

## 2020-03-11 DIAGNOSIS — Z20822 Contact with and (suspected) exposure to covid-19: Secondary | ICD-10-CM | POA: Diagnosis not present

## 2020-04-05 DIAGNOSIS — M8949 Other hypertrophic osteoarthropathy, multiple sites: Secondary | ICD-10-CM | POA: Diagnosis not present

## 2020-04-05 DIAGNOSIS — Z1211 Encounter for screening for malignant neoplasm of colon: Secondary | ICD-10-CM | POA: Diagnosis not present

## 2020-04-05 DIAGNOSIS — I700 Atherosclerosis of aorta: Secondary | ICD-10-CM | POA: Diagnosis not present

## 2020-04-05 DIAGNOSIS — E7800 Pure hypercholesterolemia, unspecified: Secondary | ICD-10-CM | POA: Diagnosis not present

## 2020-04-05 DIAGNOSIS — Z1159 Encounter for screening for other viral diseases: Secondary | ICD-10-CM | POA: Diagnosis not present

## 2020-04-05 DIAGNOSIS — Z1231 Encounter for screening mammogram for malignant neoplasm of breast: Secondary | ICD-10-CM | POA: Diagnosis not present

## 2020-04-05 DIAGNOSIS — Z0000 Encounter for general adult medical examination without abnormal findings: Secondary | ICD-10-CM | POA: Diagnosis not present

## 2020-04-05 DIAGNOSIS — J439 Emphysema, unspecified: Secondary | ICD-10-CM | POA: Diagnosis not present

## 2020-04-05 DIAGNOSIS — Z79891 Long term (current) use of opiate analgesic: Secondary | ICD-10-CM | POA: Diagnosis not present

## 2020-04-12 DIAGNOSIS — J439 Emphysema, unspecified: Secondary | ICD-10-CM | POA: Diagnosis not present

## 2020-06-21 ENCOUNTER — Other Ambulatory Visit: Payer: Self-pay | Admitting: Internal Medicine

## 2020-06-21 DIAGNOSIS — Z1231 Encounter for screening mammogram for malignant neoplasm of breast: Secondary | ICD-10-CM

## 2020-06-23 ENCOUNTER — Other Ambulatory Visit: Payer: Self-pay

## 2020-06-23 ENCOUNTER — Ambulatory Visit
Admission: RE | Admit: 2020-06-23 | Discharge: 2020-06-23 | Disposition: A | Discharge status: Home / Self Care | Payer: PPO | Source: Ambulatory Visit | Attending: Internal Medicine | Admitting: Internal Medicine

## 2020-06-23 DIAGNOSIS — Z1231 Encounter for screening mammogram for malignant neoplasm of breast: Secondary | ICD-10-CM | POA: Diagnosis not present

## 2020-06-29 DIAGNOSIS — E7800 Pure hypercholesterolemia, unspecified: Secondary | ICD-10-CM | POA: Diagnosis not present

## 2020-06-29 DIAGNOSIS — I700 Atherosclerosis of aorta: Secondary | ICD-10-CM | POA: Diagnosis not present

## 2020-06-29 DIAGNOSIS — Z1159 Encounter for screening for other viral diseases: Secondary | ICD-10-CM | POA: Diagnosis not present

## 2020-06-29 DIAGNOSIS — Z79891 Long term (current) use of opiate analgesic: Secondary | ICD-10-CM | POA: Diagnosis not present

## 2020-07-06 DIAGNOSIS — M159 Polyosteoarthritis, unspecified: Secondary | ICD-10-CM | POA: Diagnosis not present

## 2020-07-06 DIAGNOSIS — E7800 Pure hypercholesterolemia, unspecified: Secondary | ICD-10-CM | POA: Diagnosis not present

## 2020-07-06 DIAGNOSIS — I700 Atherosclerosis of aorta: Secondary | ICD-10-CM | POA: Diagnosis not present

## 2020-07-06 DIAGNOSIS — E538 Deficiency of other specified B group vitamins: Secondary | ICD-10-CM | POA: Diagnosis not present

## 2020-07-06 DIAGNOSIS — J439 Emphysema, unspecified: Secondary | ICD-10-CM | POA: Diagnosis not present

## 2020-07-06 DIAGNOSIS — Z79891 Long term (current) use of opiate analgesic: Secondary | ICD-10-CM | POA: Diagnosis not present

## 2020-07-06 DIAGNOSIS — E039 Hypothyroidism, unspecified: Secondary | ICD-10-CM | POA: Diagnosis not present

## 2020-07-20 DIAGNOSIS — Z23 Encounter for immunization: Secondary | ICD-10-CM | POA: Diagnosis not present

## 2020-07-20 DIAGNOSIS — J439 Emphysema, unspecified: Secondary | ICD-10-CM | POA: Diagnosis not present

## 2020-07-20 DIAGNOSIS — R0600 Dyspnea, unspecified: Secondary | ICD-10-CM | POA: Diagnosis not present

## 2020-11-18 DIAGNOSIS — J310 Chronic rhinitis: Secondary | ICD-10-CM | POA: Diagnosis not present

## 2020-11-18 DIAGNOSIS — J449 Chronic obstructive pulmonary disease, unspecified: Secondary | ICD-10-CM | POA: Diagnosis not present

## 2020-11-18 DIAGNOSIS — R0609 Other forms of dyspnea: Secondary | ICD-10-CM | POA: Diagnosis not present

## 2020-11-18 DIAGNOSIS — Z23 Encounter for immunization: Secondary | ICD-10-CM | POA: Diagnosis not present

## 2020-12-10 DIAGNOSIS — H2513 Age-related nuclear cataract, bilateral: Secondary | ICD-10-CM | POA: Diagnosis not present

## 2021-01-03 DIAGNOSIS — E538 Deficiency of other specified B group vitamins: Secondary | ICD-10-CM | POA: Diagnosis not present

## 2021-01-03 DIAGNOSIS — I700 Atherosclerosis of aorta: Secondary | ICD-10-CM | POA: Diagnosis not present

## 2021-01-03 DIAGNOSIS — E7800 Pure hypercholesterolemia, unspecified: Secondary | ICD-10-CM | POA: Diagnosis not present

## 2021-01-10 DIAGNOSIS — E7800 Pure hypercholesterolemia, unspecified: Secondary | ICD-10-CM | POA: Diagnosis not present

## 2021-01-10 DIAGNOSIS — E039 Hypothyroidism, unspecified: Secondary | ICD-10-CM | POA: Diagnosis not present

## 2021-01-10 DIAGNOSIS — M159 Polyosteoarthritis, unspecified: Secondary | ICD-10-CM | POA: Diagnosis not present

## 2021-01-10 DIAGNOSIS — Z79891 Long term (current) use of opiate analgesic: Secondary | ICD-10-CM | POA: Diagnosis not present

## 2021-01-10 DIAGNOSIS — Z780 Asymptomatic menopausal state: Secondary | ICD-10-CM | POA: Diagnosis not present

## 2021-01-10 DIAGNOSIS — I700 Atherosclerosis of aorta: Secondary | ICD-10-CM | POA: Diagnosis not present

## 2021-01-10 DIAGNOSIS — J439 Emphysema, unspecified: Secondary | ICD-10-CM | POA: Diagnosis not present

## 2021-02-09 DIAGNOSIS — E039 Hypothyroidism, unspecified: Secondary | ICD-10-CM | POA: Diagnosis not present

## 2021-02-16 DIAGNOSIS — M5412 Radiculopathy, cervical region: Secondary | ICD-10-CM | POA: Diagnosis not present

## 2021-02-16 DIAGNOSIS — M5033 Other cervical disc degeneration, cervicothoracic region: Secondary | ICD-10-CM | POA: Diagnosis not present

## 2021-02-16 DIAGNOSIS — M9901 Segmental and somatic dysfunction of cervical region: Secondary | ICD-10-CM | POA: Diagnosis not present

## 2021-02-16 DIAGNOSIS — M9902 Segmental and somatic dysfunction of thoracic region: Secondary | ICD-10-CM | POA: Diagnosis not present

## 2021-02-17 DIAGNOSIS — M5412 Radiculopathy, cervical region: Secondary | ICD-10-CM | POA: Diagnosis not present

## 2021-02-17 DIAGNOSIS — M9902 Segmental and somatic dysfunction of thoracic region: Secondary | ICD-10-CM | POA: Diagnosis not present

## 2021-02-17 DIAGNOSIS — M5033 Other cervical disc degeneration, cervicothoracic region: Secondary | ICD-10-CM | POA: Diagnosis not present

## 2021-02-17 DIAGNOSIS — M9901 Segmental and somatic dysfunction of cervical region: Secondary | ICD-10-CM | POA: Diagnosis not present

## 2021-05-03 DIAGNOSIS — J439 Emphysema, unspecified: Secondary | ICD-10-CM | POA: Diagnosis not present

## 2021-05-03 DIAGNOSIS — I700 Atherosclerosis of aorta: Secondary | ICD-10-CM | POA: Diagnosis not present

## 2021-05-03 DIAGNOSIS — E7800 Pure hypercholesterolemia, unspecified: Secondary | ICD-10-CM | POA: Diagnosis not present

## 2021-05-03 DIAGNOSIS — E039 Hypothyroidism, unspecified: Secondary | ICD-10-CM | POA: Diagnosis not present

## 2021-05-10 DIAGNOSIS — F17201 Nicotine dependence, unspecified, in remission: Secondary | ICD-10-CM | POA: Diagnosis not present

## 2021-05-10 DIAGNOSIS — E7800 Pure hypercholesterolemia, unspecified: Secondary | ICD-10-CM | POA: Diagnosis not present

## 2021-05-10 DIAGNOSIS — M159 Polyosteoarthritis, unspecified: Secondary | ICD-10-CM | POA: Diagnosis not present

## 2021-05-10 DIAGNOSIS — I700 Atherosclerosis of aorta: Secondary | ICD-10-CM | POA: Diagnosis not present

## 2021-05-10 DIAGNOSIS — Z0000 Encounter for general adult medical examination without abnormal findings: Secondary | ICD-10-CM | POA: Diagnosis not present

## 2021-05-10 DIAGNOSIS — Z79891 Long term (current) use of opiate analgesic: Secondary | ICD-10-CM | POA: Diagnosis not present

## 2021-05-10 DIAGNOSIS — F17211 Nicotine dependence, cigarettes, in remission: Secondary | ICD-10-CM | POA: Diagnosis not present

## 2021-05-10 DIAGNOSIS — Z1211 Encounter for screening for malignant neoplasm of colon: Secondary | ICD-10-CM | POA: Diagnosis not present

## 2021-05-10 DIAGNOSIS — E039 Hypothyroidism, unspecified: Secondary | ICD-10-CM | POA: Diagnosis not present

## 2021-05-10 DIAGNOSIS — J439 Emphysema, unspecified: Secondary | ICD-10-CM | POA: Diagnosis not present

## 2021-05-18 DIAGNOSIS — Z1211 Encounter for screening for malignant neoplasm of colon: Secondary | ICD-10-CM | POA: Diagnosis not present

## 2021-05-23 ENCOUNTER — Other Ambulatory Visit: Payer: Self-pay | Admitting: Internal Medicine

## 2021-05-23 DIAGNOSIS — Z1231 Encounter for screening mammogram for malignant neoplasm of breast: Secondary | ICD-10-CM

## 2021-05-26 DIAGNOSIS — J439 Emphysema, unspecified: Secondary | ICD-10-CM | POA: Diagnosis not present

## 2021-05-26 DIAGNOSIS — R0609 Other forms of dyspnea: Secondary | ICD-10-CM | POA: Diagnosis not present

## 2021-05-26 LAB — COLOGUARD: COLOGUARD: NEGATIVE

## 2021-05-30 DIAGNOSIS — E039 Hypothyroidism, unspecified: Secondary | ICD-10-CM | POA: Diagnosis not present

## 2021-06-27 ENCOUNTER — Ambulatory Visit
Admission: RE | Admit: 2021-06-27 | Discharge: 2021-06-27 | Disposition: A | Discharge status: Home / Self Care | Payer: PPO | Source: Ambulatory Visit | Attending: Internal Medicine | Admitting: Internal Medicine

## 2021-06-27 DIAGNOSIS — M5416 Radiculopathy, lumbar region: Secondary | ICD-10-CM | POA: Diagnosis not present

## 2021-06-27 DIAGNOSIS — Z1231 Encounter for screening mammogram for malignant neoplasm of breast: Secondary | ICD-10-CM | POA: Diagnosis not present

## 2021-06-27 DIAGNOSIS — M9903 Segmental and somatic dysfunction of lumbar region: Secondary | ICD-10-CM | POA: Diagnosis not present

## 2021-06-27 DIAGNOSIS — M9901 Segmental and somatic dysfunction of cervical region: Secondary | ICD-10-CM | POA: Diagnosis not present

## 2021-06-27 DIAGNOSIS — M5136 Other intervertebral disc degeneration, lumbar region: Secondary | ICD-10-CM | POA: Diagnosis not present

## 2021-06-29 DIAGNOSIS — M9901 Segmental and somatic dysfunction of cervical region: Secondary | ICD-10-CM | POA: Diagnosis not present

## 2021-06-29 DIAGNOSIS — M5416 Radiculopathy, lumbar region: Secondary | ICD-10-CM | POA: Diagnosis not present

## 2021-06-29 DIAGNOSIS — M5136 Other intervertebral disc degeneration, lumbar region: Secondary | ICD-10-CM | POA: Diagnosis not present

## 2021-06-29 DIAGNOSIS — M9903 Segmental and somatic dysfunction of lumbar region: Secondary | ICD-10-CM | POA: Diagnosis not present

## 2021-06-30 DIAGNOSIS — M9903 Segmental and somatic dysfunction of lumbar region: Secondary | ICD-10-CM | POA: Diagnosis not present

## 2021-06-30 DIAGNOSIS — M9901 Segmental and somatic dysfunction of cervical region: Secondary | ICD-10-CM | POA: Diagnosis not present

## 2021-06-30 DIAGNOSIS — M5136 Other intervertebral disc degeneration, lumbar region: Secondary | ICD-10-CM | POA: Diagnosis not present

## 2021-06-30 DIAGNOSIS — M5416 Radiculopathy, lumbar region: Secondary | ICD-10-CM | POA: Diagnosis not present

## 2021-07-05 DIAGNOSIS — M5136 Other intervertebral disc degeneration, lumbar region: Secondary | ICD-10-CM | POA: Diagnosis not present

## 2021-07-05 DIAGNOSIS — M9901 Segmental and somatic dysfunction of cervical region: Secondary | ICD-10-CM | POA: Diagnosis not present

## 2021-07-05 DIAGNOSIS — M5416 Radiculopathy, lumbar region: Secondary | ICD-10-CM | POA: Diagnosis not present

## 2021-07-05 DIAGNOSIS — M9903 Segmental and somatic dysfunction of lumbar region: Secondary | ICD-10-CM | POA: Diagnosis not present

## 2021-07-12 DIAGNOSIS — M9901 Segmental and somatic dysfunction of cervical region: Secondary | ICD-10-CM | POA: Diagnosis not present

## 2021-07-12 DIAGNOSIS — M5136 Other intervertebral disc degeneration, lumbar region: Secondary | ICD-10-CM | POA: Diagnosis not present

## 2021-07-12 DIAGNOSIS — M9903 Segmental and somatic dysfunction of lumbar region: Secondary | ICD-10-CM | POA: Diagnosis not present

## 2021-07-12 DIAGNOSIS — M5416 Radiculopathy, lumbar region: Secondary | ICD-10-CM | POA: Diagnosis not present

## 2021-09-26 DIAGNOSIS — J449 Chronic obstructive pulmonary disease, unspecified: Secondary | ICD-10-CM | POA: Diagnosis not present

## 2021-09-26 DIAGNOSIS — R0609 Other forms of dyspnea: Secondary | ICD-10-CM | POA: Diagnosis not present

## 2021-11-01 DIAGNOSIS — E7800 Pure hypercholesterolemia, unspecified: Secondary | ICD-10-CM | POA: Diagnosis not present

## 2021-11-01 DIAGNOSIS — E039 Hypothyroidism, unspecified: Secondary | ICD-10-CM | POA: Diagnosis not present

## 2021-11-01 DIAGNOSIS — I700 Atherosclerosis of aorta: Secondary | ICD-10-CM | POA: Diagnosis not present

## 2021-11-01 DIAGNOSIS — Z79891 Long term (current) use of opiate analgesic: Secondary | ICD-10-CM | POA: Diagnosis not present

## 2021-11-01 DIAGNOSIS — J439 Emphysema, unspecified: Secondary | ICD-10-CM | POA: Diagnosis not present

## 2021-11-08 DIAGNOSIS — E039 Hypothyroidism, unspecified: Secondary | ICD-10-CM | POA: Diagnosis not present

## 2021-11-08 DIAGNOSIS — T466X5A Adverse effect of antihyperlipidemic and antiarteriosclerotic drugs, initial encounter: Secondary | ICD-10-CM | POA: Diagnosis not present

## 2021-11-08 DIAGNOSIS — M159 Polyosteoarthritis, unspecified: Secondary | ICD-10-CM | POA: Diagnosis not present

## 2021-11-08 DIAGNOSIS — G720 Drug-induced myopathy: Secondary | ICD-10-CM | POA: Diagnosis not present

## 2021-11-08 DIAGNOSIS — J439 Emphysema, unspecified: Secondary | ICD-10-CM | POA: Diagnosis not present

## 2021-11-08 DIAGNOSIS — M9903 Segmental and somatic dysfunction of lumbar region: Secondary | ICD-10-CM | POA: Diagnosis not present

## 2021-11-08 DIAGNOSIS — M9901 Segmental and somatic dysfunction of cervical region: Secondary | ICD-10-CM | POA: Diagnosis not present

## 2021-11-08 DIAGNOSIS — Z79891 Long term (current) use of opiate analgesic: Secondary | ICD-10-CM | POA: Diagnosis not present

## 2021-11-08 DIAGNOSIS — Z23 Encounter for immunization: Secondary | ICD-10-CM | POA: Diagnosis not present

## 2021-11-08 DIAGNOSIS — M5416 Radiculopathy, lumbar region: Secondary | ICD-10-CM | POA: Diagnosis not present

## 2021-11-08 DIAGNOSIS — E7800 Pure hypercholesterolemia, unspecified: Secondary | ICD-10-CM | POA: Diagnosis not present

## 2021-11-08 DIAGNOSIS — M5136 Other intervertebral disc degeneration, lumbar region: Secondary | ICD-10-CM | POA: Diagnosis not present

## 2021-11-08 DIAGNOSIS — I700 Atherosclerosis of aorta: Secondary | ICD-10-CM | POA: Diagnosis not present

## 2021-11-10 DIAGNOSIS — M9903 Segmental and somatic dysfunction of lumbar region: Secondary | ICD-10-CM | POA: Diagnosis not present

## 2021-11-10 DIAGNOSIS — M9901 Segmental and somatic dysfunction of cervical region: Secondary | ICD-10-CM | POA: Diagnosis not present

## 2021-11-10 DIAGNOSIS — M5416 Radiculopathy, lumbar region: Secondary | ICD-10-CM | POA: Diagnosis not present

## 2021-11-10 DIAGNOSIS — M5136 Other intervertebral disc degeneration, lumbar region: Secondary | ICD-10-CM | POA: Diagnosis not present

## 2021-11-28 DIAGNOSIS — E039 Hypothyroidism, unspecified: Secondary | ICD-10-CM | POA: Diagnosis not present

## 2022-03-23 ENCOUNTER — Encounter: Payer: Self-pay | Admitting: *Deleted

## 2022-05-08 DIAGNOSIS — Z79891 Long term (current) use of opiate analgesic: Secondary | ICD-10-CM | POA: Diagnosis not present

## 2022-05-08 DIAGNOSIS — I700 Atherosclerosis of aorta: Secondary | ICD-10-CM | POA: Diagnosis not present

## 2022-05-08 DIAGNOSIS — E039 Hypothyroidism, unspecified: Secondary | ICD-10-CM | POA: Diagnosis not present

## 2022-05-08 DIAGNOSIS — E7800 Pure hypercholesterolemia, unspecified: Secondary | ICD-10-CM | POA: Diagnosis not present

## 2022-05-15 DIAGNOSIS — Z780 Asymptomatic menopausal state: Secondary | ICD-10-CM | POA: Diagnosis not present

## 2022-05-15 DIAGNOSIS — T466X5A Adverse effect of antihyperlipidemic and antiarteriosclerotic drugs, initial encounter: Secondary | ICD-10-CM | POA: Diagnosis not present

## 2022-05-15 DIAGNOSIS — E039 Hypothyroidism, unspecified: Secondary | ICD-10-CM | POA: Diagnosis not present

## 2022-05-15 DIAGNOSIS — J439 Emphysema, unspecified: Secondary | ICD-10-CM | POA: Diagnosis not present

## 2022-05-15 DIAGNOSIS — Z79891 Long term (current) use of opiate analgesic: Secondary | ICD-10-CM | POA: Diagnosis not present

## 2022-05-15 DIAGNOSIS — I700 Atherosclerosis of aorta: Secondary | ICD-10-CM | POA: Diagnosis not present

## 2022-05-15 DIAGNOSIS — G720 Drug-induced myopathy: Secondary | ICD-10-CM | POA: Diagnosis not present

## 2022-05-15 DIAGNOSIS — Z0000 Encounter for general adult medical examination without abnormal findings: Secondary | ICD-10-CM | POA: Diagnosis not present

## 2022-05-15 DIAGNOSIS — E7800 Pure hypercholesterolemia, unspecified: Secondary | ICD-10-CM | POA: Diagnosis not present

## 2022-06-08 DIAGNOSIS — M8588 Other specified disorders of bone density and structure, other site: Secondary | ICD-10-CM | POA: Diagnosis not present

## 2022-08-14 ENCOUNTER — Other Ambulatory Visit: Payer: Self-pay | Admitting: Internal Medicine

## 2022-08-14 DIAGNOSIS — Z1231 Encounter for screening mammogram for malignant neoplasm of breast: Secondary | ICD-10-CM

## 2022-09-04 ENCOUNTER — Ambulatory Visit
Admission: RE | Admit: 2022-09-04 | Discharge: 2022-09-04 | Disposition: A | Discharge status: Home / Self Care | Payer: PPO | Source: Ambulatory Visit | Attending: Internal Medicine | Admitting: Internal Medicine

## 2022-09-04 DIAGNOSIS — Z1231 Encounter for screening mammogram for malignant neoplasm of breast: Secondary | ICD-10-CM | POA: Diagnosis not present

## 2022-09-30 IMAGING — MG MM DIGITAL SCREENING BILAT W/ TOMO AND CAD
8 series · 8 of 24 positions shown · non-contrast
Comparison: Previous exam(s).

CLINICAL DATA: Screening.

EXAM:
DIGITAL SCREENING BILATERAL MAMMOGRAM WITH TOMOSYNTHESIS AND CAD
TECHNIQUE: Bilateral screening digital craniocaudal and mediolateral oblique
mammograms were obtained. Bilateral screening digital breast
tomosynthesis was performed. The images were evaluated with
computer-aided detection.

[L CC synth-2D]
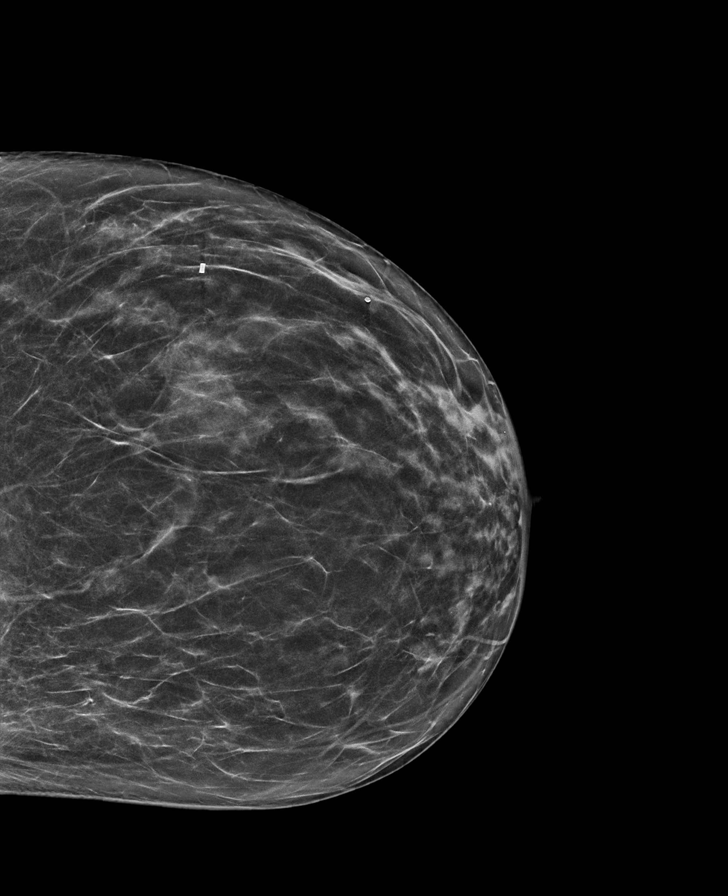

[L MLO synth-2D]
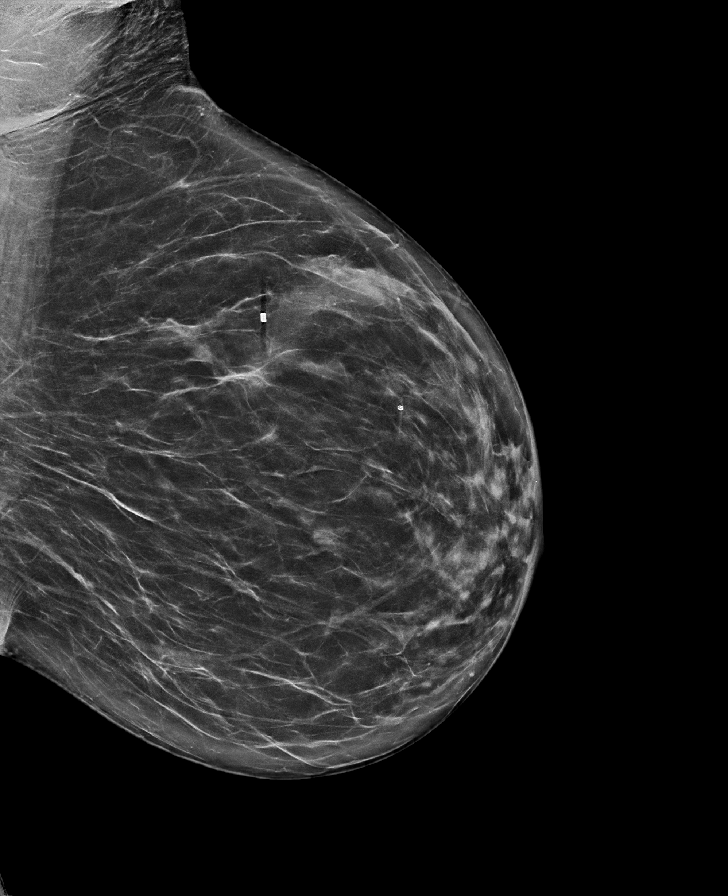

[R CC synth-2D]
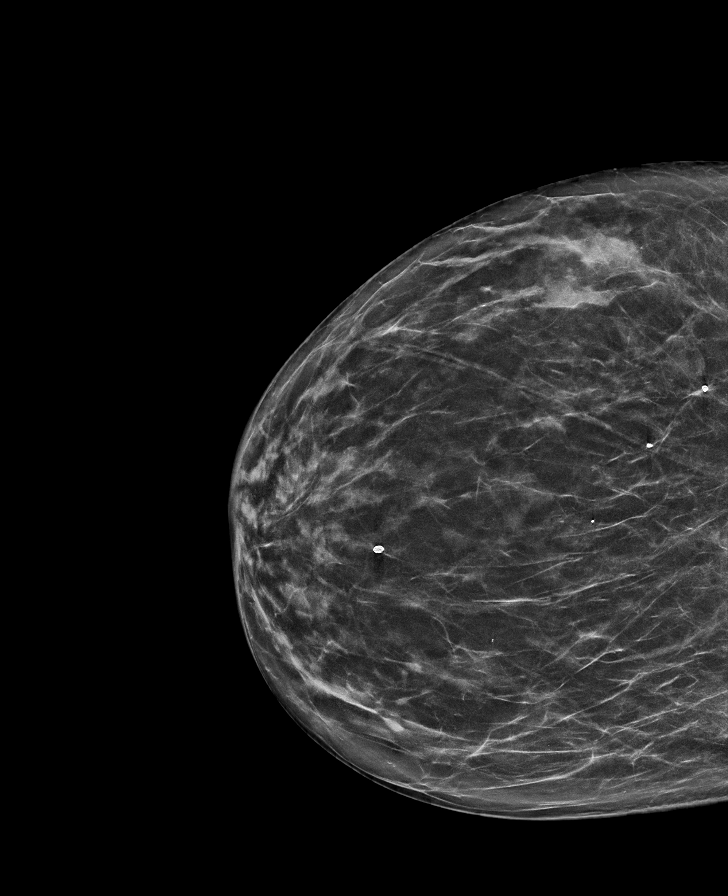

[R MLO synth-2D]
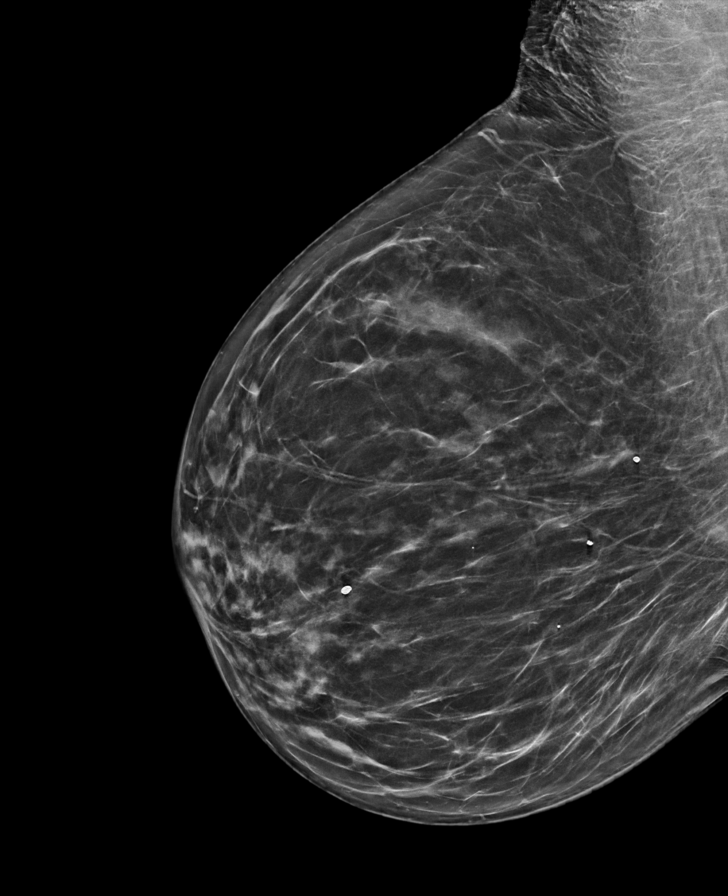

[L CC tomo · tomo slice 33/64.0]
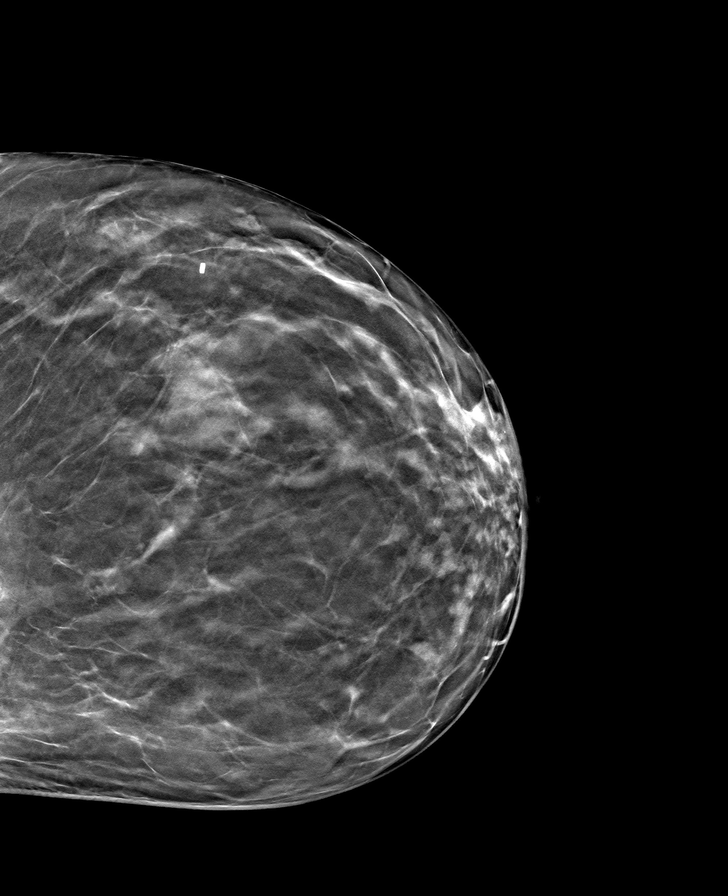

[R CC tomo · tomo slice 36/71.0]
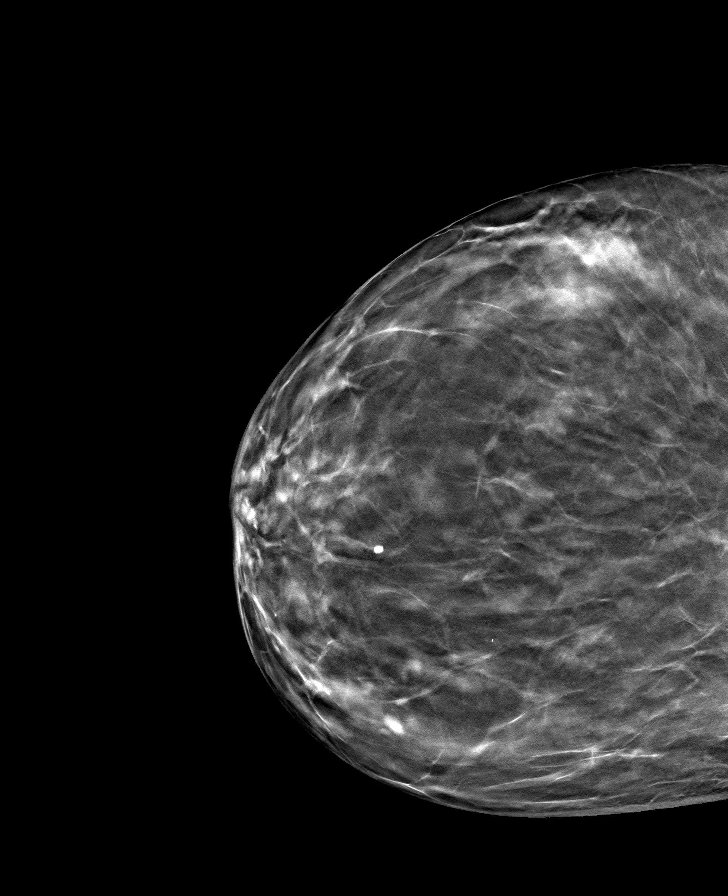

[L MLO tomo · tomo slice 41/81.0]
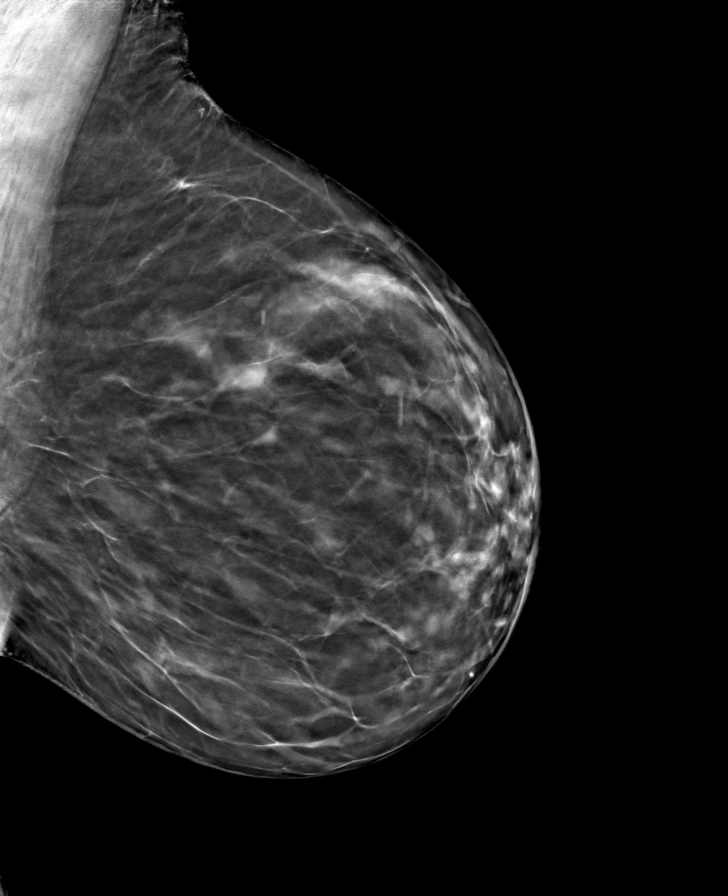

[R MLO tomo · tomo slice 35/70.0]
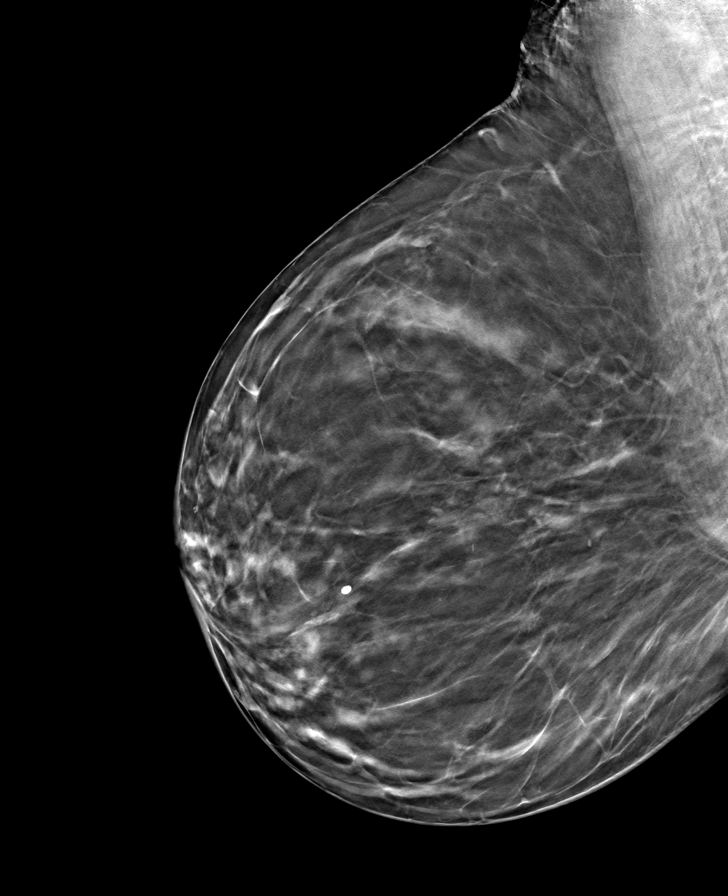

[8 of 24 positions shown; findings below may reference images not displayed]

ACR Breast Density Category b: There are scattered areas of
fibroglandular density.
FINDINGS: There are no findings suspicious for malignancy.
IMPRESSION: No mammographic evidence of malignancy. A result letter of this
screening mammogram will be mailed directly to the patient.

RECOMMENDATION:
Screening mammogram in one year. (Code:51-O-LD2)

BI-RADS CATEGORY  1: Negative.

## 2022-11-07 DIAGNOSIS — J439 Emphysema, unspecified: Secondary | ICD-10-CM | POA: Diagnosis not present

## 2022-11-07 DIAGNOSIS — I700 Atherosclerosis of aorta: Secondary | ICD-10-CM | POA: Diagnosis not present

## 2022-11-07 DIAGNOSIS — E039 Hypothyroidism, unspecified: Secondary | ICD-10-CM | POA: Diagnosis not present

## 2022-11-07 DIAGNOSIS — E7800 Pure hypercholesterolemia, unspecified: Secondary | ICD-10-CM | POA: Diagnosis not present

## 2022-11-07 DIAGNOSIS — Z79891 Long term (current) use of opiate analgesic: Secondary | ICD-10-CM | POA: Diagnosis not present

## 2022-11-09 DIAGNOSIS — R0609 Other forms of dyspnea: Secondary | ICD-10-CM | POA: Diagnosis not present

## 2022-11-09 DIAGNOSIS — J449 Chronic obstructive pulmonary disease, unspecified: Secondary | ICD-10-CM | POA: Diagnosis not present

## 2022-11-09 DIAGNOSIS — Z87891 Personal history of nicotine dependence: Secondary | ICD-10-CM | POA: Diagnosis not present

## 2022-11-14 DIAGNOSIS — Z23 Encounter for immunization: Secondary | ICD-10-CM | POA: Diagnosis not present

## 2022-11-14 DIAGNOSIS — J439 Emphysema, unspecified: Secondary | ICD-10-CM | POA: Diagnosis not present

## 2022-11-14 DIAGNOSIS — E7800 Pure hypercholesterolemia, unspecified: Secondary | ICD-10-CM | POA: Diagnosis not present

## 2022-11-14 DIAGNOSIS — E039 Hypothyroidism, unspecified: Secondary | ICD-10-CM | POA: Diagnosis not present

## 2022-11-14 DIAGNOSIS — M150 Primary generalized (osteo)arthritis: Secondary | ICD-10-CM | POA: Diagnosis not present

## 2022-11-14 DIAGNOSIS — I700 Atherosclerosis of aorta: Secondary | ICD-10-CM | POA: Diagnosis not present

## 2022-11-14 DIAGNOSIS — G720 Drug-induced myopathy: Secondary | ICD-10-CM | POA: Diagnosis not present

## 2022-11-14 DIAGNOSIS — T466X5A Adverse effect of antihyperlipidemic and antiarteriosclerotic drugs, initial encounter: Secondary | ICD-10-CM | POA: Diagnosis not present

## 2022-11-14 DIAGNOSIS — Z79891 Long term (current) use of opiate analgesic: Secondary | ICD-10-CM | POA: Diagnosis not present

## 2022-11-16 DIAGNOSIS — M9902 Segmental and somatic dysfunction of thoracic region: Secondary | ICD-10-CM | POA: Diagnosis not present

## 2022-11-16 DIAGNOSIS — M5387 Other specified dorsopathies, lumbosacral region: Secondary | ICD-10-CM | POA: Diagnosis not present

## 2022-11-16 DIAGNOSIS — M9901 Segmental and somatic dysfunction of cervical region: Secondary | ICD-10-CM | POA: Diagnosis not present

## 2022-11-16 DIAGNOSIS — M9904 Segmental and somatic dysfunction of sacral region: Secondary | ICD-10-CM | POA: Diagnosis not present

## 2022-11-16 DIAGNOSIS — M9905 Segmental and somatic dysfunction of pelvic region: Secondary | ICD-10-CM | POA: Diagnosis not present

## 2022-11-16 DIAGNOSIS — M9903 Segmental and somatic dysfunction of lumbar region: Secondary | ICD-10-CM | POA: Diagnosis not present

## 2022-11-16 DIAGNOSIS — M5417 Radiculopathy, lumbosacral region: Secondary | ICD-10-CM | POA: Diagnosis not present

## 2022-11-21 DIAGNOSIS — M9905 Segmental and somatic dysfunction of pelvic region: Secondary | ICD-10-CM | POA: Diagnosis not present

## 2022-11-21 DIAGNOSIS — M9902 Segmental and somatic dysfunction of thoracic region: Secondary | ICD-10-CM | POA: Diagnosis not present

## 2022-11-21 DIAGNOSIS — M5387 Other specified dorsopathies, lumbosacral region: Secondary | ICD-10-CM | POA: Diagnosis not present

## 2022-11-21 DIAGNOSIS — M9901 Segmental and somatic dysfunction of cervical region: Secondary | ICD-10-CM | POA: Diagnosis not present

## 2022-11-21 DIAGNOSIS — M9904 Segmental and somatic dysfunction of sacral region: Secondary | ICD-10-CM | POA: Diagnosis not present

## 2022-11-21 DIAGNOSIS — M5417 Radiculopathy, lumbosacral region: Secondary | ICD-10-CM | POA: Diagnosis not present

## 2022-11-21 DIAGNOSIS — M9903 Segmental and somatic dysfunction of lumbar region: Secondary | ICD-10-CM | POA: Diagnosis not present

## 2022-12-04 DIAGNOSIS — R1084 Generalized abdominal pain: Secondary | ICD-10-CM | POA: Diagnosis not present

## 2022-12-06 DIAGNOSIS — B029 Zoster without complications: Secondary | ICD-10-CM | POA: Diagnosis not present

## 2022-12-11 DIAGNOSIS — E039 Hypothyroidism, unspecified: Secondary | ICD-10-CM | POA: Diagnosis not present

## 2023-01-05 ENCOUNTER — Encounter: Payer: Self-pay | Admitting: *Deleted

## 2023-05-09 DIAGNOSIS — I700 Atherosclerosis of aorta: Secondary | ICD-10-CM | POA: Diagnosis not present

## 2023-05-09 DIAGNOSIS — Z79891 Long term (current) use of opiate analgesic: Secondary | ICD-10-CM | POA: Diagnosis not present

## 2023-05-09 DIAGNOSIS — E039 Hypothyroidism, unspecified: Secondary | ICD-10-CM | POA: Diagnosis not present

## 2023-05-09 DIAGNOSIS — E7800 Pure hypercholesterolemia, unspecified: Secondary | ICD-10-CM | POA: Diagnosis not present

## 2023-05-09 DIAGNOSIS — J439 Emphysema, unspecified: Secondary | ICD-10-CM | POA: Diagnosis not present

## 2023-09-21 ENCOUNTER — Other Ambulatory Visit: Payer: Self-pay | Admitting: Internal Medicine

## 2023-09-21 DIAGNOSIS — Z1231 Encounter for screening mammogram for malignant neoplasm of breast: Secondary | ICD-10-CM

## 2023-10-01 ENCOUNTER — Ambulatory Visit
Admission: RE | Admit: 2023-10-01 | Discharge: 2023-10-01 | Disposition: A | Discharge status: Home / Self Care | Source: Ambulatory Visit | Attending: Internal Medicine | Admitting: Internal Medicine

## 2023-10-01 DIAGNOSIS — Z1231 Encounter for screening mammogram for malignant neoplasm of breast: Secondary | ICD-10-CM | POA: Diagnosis present

## 2023-10-03 ENCOUNTER — Encounter: Payer: Self-pay | Admitting: Oncology

## 2023-10-04 ENCOUNTER — Other Ambulatory Visit: Payer: Self-pay | Admitting: Internal Medicine

## 2023-10-04 DIAGNOSIS — R928 Other abnormal and inconclusive findings on diagnostic imaging of breast: Secondary | ICD-10-CM

## 2023-10-08 ENCOUNTER — Ambulatory Visit
Admission: RE | Admit: 2023-10-08 | Discharge: 2023-10-08 | Disposition: A | Discharge status: Home / Self Care | Source: Ambulatory Visit | Attending: Internal Medicine | Admitting: Internal Medicine

## 2023-10-08 ENCOUNTER — Other Ambulatory Visit: Payer: Self-pay | Admitting: Internal Medicine

## 2023-10-08 DIAGNOSIS — N63 Unspecified lump in unspecified breast: Secondary | ICD-10-CM

## 2023-10-08 DIAGNOSIS — R928 Other abnormal and inconclusive findings on diagnostic imaging of breast: Secondary | ICD-10-CM

## 2023-10-09 ENCOUNTER — Other Ambulatory Visit: Payer: Self-pay | Admitting: Internal Medicine

## 2023-10-09 DIAGNOSIS — R928 Other abnormal and inconclusive findings on diagnostic imaging of breast: Secondary | ICD-10-CM

## 2023-10-10 ENCOUNTER — Ambulatory Visit
Admission: RE | Admit: 2023-10-10 | Discharge: 2023-10-10 | Disposition: A | Discharge status: Home / Self Care | Source: Ambulatory Visit | Attending: Internal Medicine | Admitting: Internal Medicine

## 2023-10-10 DIAGNOSIS — R928 Other abnormal and inconclusive findings on diagnostic imaging of breast: Secondary | ICD-10-CM | POA: Insufficient documentation

## 2023-10-10 DIAGNOSIS — N6031 Fibrosclerosis of right breast: Secondary | ICD-10-CM | POA: Diagnosis present

## 2023-10-10 HISTORY — PX: BREAST BIOPSY: SHX20

## 2023-10-10 MED ORDER — LIDOCAINE-EPINEPHRINE 1 %-1:100000 IJ SOLN
20.0000 mL | Freq: Once | INTRAMUSCULAR | Status: AC
  Administered 2023-10-10: 20 mL
  Filled 2023-10-10: qty 20

## 2023-10-10 MED ORDER — LIDOCAINE 1 % OPTIME INJ - NO CHARGE
5.0000 mL | Freq: Once | INTRAMUSCULAR | Status: AC
  Administered 2023-10-10: 5 mL
  Filled 2023-10-10: qty 6

## 2023-10-11 LAB — SURGICAL PATHOLOGY
